# Patient Record
Sex: Female | Born: 1955 | Race: White | Hispanic: No | Smoking: Never smoker
Health system: Southern US, Community
[De-identification: ages and names within clinical notes are randomized; demographics above are authoritative.]

## PROBLEM LIST (undated history)

## (undated) DIAGNOSIS — F419 Anxiety disorder, unspecified: Secondary | ICD-10-CM

## (undated) DIAGNOSIS — T7840XA Allergy, unspecified, initial encounter: Secondary | ICD-10-CM

## (undated) DIAGNOSIS — I10 Essential (primary) hypertension: Secondary | ICD-10-CM

## (undated) HISTORY — DX: Anxiety disorder, unspecified: F41.9

## (undated) HISTORY — DX: Allergy, unspecified, initial encounter: T78.40XA

## (undated) HISTORY — DX: Essential (primary) hypertension: I10

---

## 1997-10-18 ENCOUNTER — Other Ambulatory Visit: Admission: RE | Admit: 1997-10-18 | Discharge: 1997-10-18 | Payer: Self-pay | Admitting: *Deleted

## 1998-10-15 ENCOUNTER — Encounter: Payer: Self-pay | Admitting: Neurosurgery

## 1998-10-15 ENCOUNTER — Ambulatory Visit (HOSPITAL_COMMUNITY): Admission: RE | Admit: 1998-10-15 | Discharge: 1998-10-15 | Payer: Self-pay | Admitting: Neurosurgery

## 2002-07-13 HISTORY — PX: ABDOMINAL HYSTERECTOMY: SHX81

## 2003-08-07 ENCOUNTER — Other Ambulatory Visit: Payer: Self-pay

## 2005-07-29 ENCOUNTER — Emergency Department: Payer: Self-pay | Admitting: General Practice

## 2007-05-14 HISTORY — PX: LITHOTRIPSY: SUR834

## 2007-05-19 ENCOUNTER — Ambulatory Visit: Payer: Self-pay | Admitting: Family Medicine

## 2007-05-20 ENCOUNTER — Ambulatory Visit: Payer: Self-pay | Admitting: Urology

## 2007-05-20 ENCOUNTER — Other Ambulatory Visit: Payer: Self-pay

## 2007-05-26 ENCOUNTER — Observation Stay: Payer: Self-pay | Admitting: Urology

## 2007-05-26 ENCOUNTER — Ambulatory Visit: Payer: Self-pay | Admitting: Urology

## 2007-06-20 ENCOUNTER — Ambulatory Visit: Payer: Self-pay | Admitting: Cardiology

## 2008-03-27 ENCOUNTER — Ambulatory Visit: Payer: Self-pay | Admitting: Internal Medicine

## 2008-03-30 ENCOUNTER — Ambulatory Visit: Payer: Self-pay | Admitting: Internal Medicine

## 2008-04-05 ENCOUNTER — Ambulatory Visit: Payer: Self-pay | Admitting: Internal Medicine

## 2008-04-13 ENCOUNTER — Ambulatory Visit: Payer: Self-pay | Admitting: Internal Medicine

## 2008-04-19 ENCOUNTER — Ambulatory Visit: Payer: Self-pay | Admitting: Internal Medicine

## 2009-04-23 ENCOUNTER — Ambulatory Visit: Payer: Self-pay | Admitting: Internal Medicine

## 2009-06-11 ENCOUNTER — Ambulatory Visit: Payer: Self-pay | Admitting: Internal Medicine

## 2009-06-12 LAB — HM MAMMOGRAPHY: HM Mammogram: NORMAL

## 2009-06-13 ENCOUNTER — Ambulatory Visit: Payer: Self-pay | Admitting: Internal Medicine

## 2010-04-16 ENCOUNTER — Ambulatory Visit: Payer: Self-pay | Admitting: Internal Medicine

## 2010-07-15 ENCOUNTER — Emergency Department: Payer: Self-pay | Admitting: Emergency Medicine

## 2010-07-24 ENCOUNTER — Ambulatory Visit: Payer: Self-pay | Admitting: Urology

## 2010-07-31 ENCOUNTER — Ambulatory Visit: Payer: Self-pay | Admitting: Urology

## 2010-09-12 ENCOUNTER — Ambulatory Visit: Payer: Self-pay | Admitting: Internal Medicine

## 2011-03-20 ENCOUNTER — Encounter: Payer: Self-pay | Admitting: Internal Medicine

## 2011-03-20 ENCOUNTER — Ambulatory Visit (INDEPENDENT_AMBULATORY_CARE_PROVIDER_SITE_OTHER): Payer: Self-pay | Admitting: Internal Medicine

## 2011-03-20 VITALS — BP 118/80 | HR 92 | Temp 98.1°F | Resp 16 | Ht 62.0 in | Wt 179.0 lb

## 2011-03-20 DIAGNOSIS — Z113 Encounter for screening for infections with a predominantly sexual mode of transmission: Secondary | ICD-10-CM

## 2011-03-20 DIAGNOSIS — R109 Unspecified abdominal pain: Secondary | ICD-10-CM

## 2011-03-20 DIAGNOSIS — N760 Acute vaginitis: Secondary | ICD-10-CM

## 2011-03-20 MED ORDER — METRONIDAZOLE 500 MG PO TABS: 500.0000 mg | ORAL_TABLET | Freq: Three times a day (TID) | ORAL | Status: AC

## 2011-03-20 NOTE — Patient Instructions (Signed)
Take the metrondazole three times daily with food for one week, We wil call you with the results of the other tests.

## 2011-03-20 NOTE — Progress Notes (Signed)
  Subjective:    Patient ID: Rachel Dickson, female    DOB: [birth year], 55 y.o.   MRN: [id number]  HPI  55 y o white female presents with an 8 day history of suprapubic and vaginal pain which started during her first sexual encounter in over 15 years.  Her boyfriend admits to several extra relationship affairs and recent symptoms of a urinary tract infection , but he has not had a medical evaluation. She is concerned that he gave her an STD and is requesting evaluation.  She has been having crampy abdominal pain and odorless vaginal discharge, without fevers or dysuria.    Past Medical History  Diagnosis Date  . Hypertension   . Anxiety   . Allergy    No current outpatient prescriptions on file prior to visit.    Review of Systems  Constitutional: Negative for fever, chills and unexpected weight change.  HENT: Negative for hearing loss, ear pain, nosebleeds, congestion, sore throat, facial swelling, rhinorrhea, sneezing, mouth sores, trouble swallowing, neck pain, neck stiffness, voice change, postnasal drip, sinus pressure, tinnitus and ear discharge.   Eyes: Negative for pain, discharge, redness and visual disturbance.  Respiratory: Negative for cough, chest tightness, shortness of breath, wheezing and stridor.   Cardiovascular: Negative for chest pain, palpitations and leg swelling.  Musculoskeletal: Negative for myalgias and arthralgias.  Skin: Negative for color change and rash.  Neurological: Negative for dizziness, weakness, light-headedness and headaches.  Hematological: Negative for adenopathy.       Objective:   Physical Exam  Constitutional: She is oriented to person, place, and time. She appears well-developed and well-nourished.  HENT:  Mouth/Throat: Oropharynx is clear and moist.  Eyes: EOM are normal. Pupils are equal, round, and reactive to light. No scleral icterus.  Neck: Normal range of motion. Neck supple. No JVD present. No thyromegaly present.  Cardiovascular:  Normal rate, regular rhythm, normal heart sounds and intact distal pulses.   Pulmonary/Chest: Effort normal and breath sounds normal.  Abdominal: Soft. Bowel sounds are normal. She exhibits no mass. There is no tenderness.  Genitourinary: Vaginal discharge found.  Musculoskeletal: Normal range of motion. She exhibits no edema.  Lymphadenopathy:    She has no cervical adenopathy.  Neurological: She is alert and oriented to person, place, and time.  Skin: Skin is warm and dry.  Psychiatric: She has a normal mood and affect.          Assessment & Plan:

## 2011-03-21 LAB — GC/CHLAMYDIA PROBE AMP, GENITAL
Chlamydia, DNA Probe: NEGATIVE
GC Probe Amp, Genital: NEGATIVE

## 2011-03-21 LAB — URINALYSIS
Glucose, UA: NEGATIVE mg/dL
Ketones, ur: NEGATIVE mg/dL
Leukocytes, UA: NEGATIVE
Nitrite: NEGATIVE
Protein, ur: NEGATIVE mg/dL
Specific Gravity, Urine: 1.022 (ref 1.005–1.030)
Urobilinogen, UA: 0.2 mg/dL (ref 0.0–1.0)
pH: 6 (ref 5.0–8.0)

## 2011-03-22 DIAGNOSIS — N760 Acute vaginitis: Secondary | ICD-10-CM | POA: Insufficient documentation

## 2011-03-22 DIAGNOSIS — Z113 Encounter for screening for infections with a predominantly sexual mode of transmission: Secondary | ICD-10-CM | POA: Insufficient documentation

## 2011-03-22 NOTE — Assessment & Plan Note (Addendum)
Her pelvic exam was normal except for some normal appearing vaginal discharge.  At patient's request I am sending a sample for GC and chlamydia and will treat her empirically for B [place]alla vaginitis.  I have not testeedher for HIV since she has no symptoms of seroconversion and is very low likelihood of an accurately positive test at this point (8 days after a painful sexual encounter). Spent 15 minutes of a 30 minute encounter discussing her recent sexual encounter and her frustration and disappointment at her boyfriend's infidelity.  Also advised her  To avooid female douches, which she used recently.

## 2011-03-22 NOTE — Assessment & Plan Note (Signed)
GC and chlamydia probes were sent today.

## 2011-03-23 ENCOUNTER — Telehealth: Payer: Self-pay | Admitting: Internal Medicine

## 2011-03-23 MED ORDER — ALPRAZOLAM 0.5 MG PO TABS: 0.5000 mg | ORAL_TABLET | Freq: Every day | ORAL | Status: AC | PRN

## 2011-03-23 NOTE — Telephone Encounter (Signed)
Message copied by Duncan Dull on Mon Mar 23, 2011  3:23 PM ------      Message from: Darletta Moll A      Created: Mon Mar 23, 2011  1:07 PM       Patient notified of results.  Patient stated you have treated her in the past for depression and anxiety.  She stated with all she has just went through she has been really anxious but not emotional like she was before.  She stated she stays sick on her stomach, and nauseous.  She said in the past you prescribed xanax and was wanting to know if you could do that again.  Please advise.

## 2011-03-23 NOTE — Telephone Encounter (Signed)
Yes you can call her in alprazolam 0.5 mg once daily as needed for anxiety  #30 no refills.

## 2011-03-23 NOTE — Telephone Encounter (Signed)
Rx has been called in  

## 2011-03-25 ENCOUNTER — Emergency Department: Payer: Self-pay | Admitting: Emergency Medicine

## 2011-12-10 ENCOUNTER — Other Ambulatory Visit: Payer: Self-pay | Admitting: Internal Medicine

## 2012-07-29 ENCOUNTER — Telehealth: Payer: Self-pay | Admitting: *Deleted

## 2012-07-29 DIAGNOSIS — Z79899 Other long term (current) drug therapy: Secondary | ICD-10-CM

## 2012-07-29 DIAGNOSIS — Z13220 Encounter for screening for lipoid disorders: Secondary | ICD-10-CM

## 2012-07-29 DIAGNOSIS — R5383 Other fatigue: Secondary | ICD-10-CM

## 2012-07-29 NOTE — Telephone Encounter (Signed)
added

## 2012-07-29 NOTE — Addendum Note (Signed)
Addended by: Sherlene Shams on: 07/29/2012 12:49 PM   Modules accepted: Orders

## 2012-07-29 NOTE — Telephone Encounter (Signed)
Pt is coming in for labs Monday (01.20.2014) What labs and dx would you like? Thank you

## 2012-08-01 ENCOUNTER — Other Ambulatory Visit (INDEPENDENT_AMBULATORY_CARE_PROVIDER_SITE_OTHER): Payer: 59

## 2012-08-01 ENCOUNTER — Ambulatory Visit (INDEPENDENT_AMBULATORY_CARE_PROVIDER_SITE_OTHER): Payer: 59 | Admitting: *Deleted

## 2012-08-01 DIAGNOSIS — R5381 Other malaise: Secondary | ICD-10-CM

## 2012-08-01 DIAGNOSIS — Z13220 Encounter for screening for lipoid disorders: Secondary | ICD-10-CM

## 2012-08-01 DIAGNOSIS — Z79899 Other long term (current) drug therapy: Secondary | ICD-10-CM

## 2012-08-01 DIAGNOSIS — Z23 Encounter for immunization: Secondary | ICD-10-CM

## 2012-08-01 DIAGNOSIS — R5383 Other fatigue: Secondary | ICD-10-CM

## 2012-08-01 LAB — COMPREHENSIVE METABOLIC PANEL
AST: 32 U/L (ref 0–37)
Albumin: 3.7 g/dL (ref 3.5–5.2)
BUN: 20 mg/dL (ref 6–23)
CO2: 28 mEq/L (ref 19–32)
Calcium: 9 mg/dL (ref 8.4–10.5)
Chloride: 103 mEq/L (ref 96–112)
Creatinine, Ser: 0.9 mg/dL (ref 0.4–1.2)
GFR: 72.66 mL/min (ref >60.00)
Glucose, Bld: 106 mg/dL — ABNORMAL HIGH (ref 70–99)
Potassium: 4.2 mEq/L (ref 3.5–5.1)

## 2012-08-01 LAB — TSH: TSH: 3.57 u[IU]/mL (ref 0.35–5.50)

## 2012-08-01 LAB — LIPID PANEL
Cholesterol: 208 mg/dL — ABNORMAL HIGH (ref 0–200)
Total CHOL/HDL Ratio: 4
Triglycerides: 118 mg/dL (ref 0.0–149.0)

## 2012-08-05 ENCOUNTER — Encounter: Payer: Self-pay | Admitting: Internal Medicine

## 2012-08-05 ENCOUNTER — Ambulatory Visit (INDEPENDENT_AMBULATORY_CARE_PROVIDER_SITE_OTHER): Payer: 59 | Admitting: Internal Medicine

## 2012-08-05 VITALS — BP 120/82 | HR 90 | Temp 98.0°F | Resp 16 | Ht 62.0 in | Wt 190.5 lb

## 2012-08-05 DIAGNOSIS — Z1211 Encounter for screening for malignant neoplasm of colon: Secondary | ICD-10-CM

## 2012-08-05 DIAGNOSIS — L639 Alopecia areata, unspecified: Secondary | ICD-10-CM

## 2012-08-05 DIAGNOSIS — Z0000 Encounter for general adult medical examination without abnormal findings: Secondary | ICD-10-CM

## 2012-08-05 DIAGNOSIS — Z1239 Encounter for other screening for malignant neoplasm of breast: Secondary | ICD-10-CM

## 2012-08-05 DIAGNOSIS — E669 Obesity, unspecified: Secondary | ICD-10-CM

## 2012-08-05 LAB — FECAL OCCULT BLOOD, GUAIAC: Fecal Occult Blood: NEGATIVE

## 2012-08-05 MED ORDER — MOMETASONE FUROATE 50 MCG/ACT NA SUSP: 2.0000 | Freq: Every day | NASAL | Status: DC

## 2012-08-05 NOTE — Progress Notes (Signed)
Patient ID: Rachel Dickson, female   DOB: Feb 22, 1956, 57 y.o.   MRN: [id number]  Patient Active Problem List  Diagnosis  . Encounter for screening for infections with predominantly sexual mode of transmission  . Routine general medical examination at a health care facility  . Alopecia areata  . Obesity (BMI 30-39.9)    Subjective:  CC:   Chief Complaint  Patient presents with  . Annual Exam    HPI:   Rachel Fesperman Smithis a 57 y.o. female who presents Need for annual exam and screening labs/mammogram to obtain insurance rate reduction. Patient has been lost to followup due to losing insurance. She has several complaints today including  1)  hair loss thinning hair  2)  labile mood with decreased emotional fragility. 3)  She is having hot flashes last several weeks. She underwent hysterectomy at age 71 . 4) weight gain.  She has gained 19 pounds over the last year. She has a 40  minute one-way commute to work and does not exercise regularly and has not followed a specific diet plan. She does have plans to join a gym and obtain personal training for her daughter who is a Systems analyst.   5) Recurrent right ear pressure and sinus drainage.     Past Medical History  Diagnosis Date  . Hypertension   . Anxiety   . Allergy     Past Surgical History  Procedure Date  . Lithotripsy Nov 2008    Hospitialized [facility]  . Abdominal hysterectomy 2004         The following portions of the patient's history were reviewed and updated as appropriate: Allergies, current medications, and problem list.    Review of Systems:  Patient denies headache, fevers, malaise, unintentional weight loss, skin rash, eye pain, sinus congestion and sinus pain, sore throat, dysphagia,  hemoptysis , cough, dyspnea, wheezing, chest pain, palpitations, orthopnea, edema, abdominal pain, nausea, melena, diarrhea, constipation, flank pain, dysuria, hematuria, urinary  Frequency, nocturia, numbness, tingling,  seizures,  Focal weakness, Loss of consciousness,  Tremor, insomnia, depression, anxiety, and suicidal ideation.     History   Social History  . Marital Status: Widowed    Spouse Name: N/A    Number of Children: N/A  . Years of Education: N/A   Occupational History  . Not on file.   Social History Main Topics  . Smoking status: Never Smoker   . Smokeless tobacco: Never Used  . Alcohol Use: Yes     Comment: occasional  . Drug Use: No  . Sexually Active: Not on file   Other Topics Concern  . Not on file   Social History Narrative  . No narrative on file    Objective:  BP 120/82  Pulse 90  Temp 98 F (36.7 C) (Oral)  Resp 16  Ht 5\' 2"  (1.575 m)  Wt 190 lb 8 oz (86.41 kg)  BMI 34.84 kg/m2  SpO2 97%  General appearance: alert, cooperative and appears stated age Ears: normal TM's and external ear canals both ears Throat: lips, mucosa, and tongue normal; teeth and gums normal Neck: no adenopathy, no carotid bruit, supple, symmetrical, trachea midline and thyroid not enlarged, symmetric, no tenderness/mass/nodules Back: symmetric, no curvature. ROM normal. No CVA tenderness. Lungs: clear to auscultation bilaterally Heart: regular rate and rhythm, S1, S2 normal, no murmur, click, rub or gallop Abdomen: soft, non-tender; bowel sounds normal; no masses,  no organomegaly Pulses: 2+ and symmetric Skin: Skin color, texture, turgor normal. No  rashes or lesions Lymph nodes: Cervical, supraclavicular, and axillary nodes normal. Rectal: heme negative, no masses.   Assessment and Plan:  Routine general medical examination at a health care facility Comprehensive medical exam without breast and pelvic was done today. Rectal exam was done to obtain fecal occult blood testing (which was negative) as required by her insurance. Mammogram has been ordered.  Alopecia areata She does have some thinning on the crown of her of her scalp but not in a female pattern baldness pattern.  Checking thyroid function with next screening lab draw  Obesity (BMI 30-39.9) I have addressed  BMI and recommended a low glycemic index diet utilizing smaller more frequent meals to increase metabolism.  I have also recommended that patient start exercising with a goal of 30 minutes of aerobic exercise a minimum of 5 days per week. Screening for lipid disorders, thyroid and diabetes was done prior to visit and LDL was 130,  Fasting glucose 106 and TSH 3.57      Updated Medication List Outpatient Encounter Prescriptions as of 08/05/2012  Medication Sig Dispense Refill  . Biotin 1000 MCG tablet Take 1,000 mcg by mouth daily.      . COLLAGEN PO Take by mouth.      . folic acid (FOLVITE) 1 MG tablet Take 1 mg by mouth daily.      Marland Kitchen lisinopril (PRINIVIL,ZESTRIL) 40 MG tablet TAKE ONE TABLET BY MOUTH EVERY DAY  90 tablet  3  . Multiple Vitamin (MULTIVITAMIN) tablet Take 1 tablet by mouth daily.      . [DISCONTINUED] Calcium Carbonate-Vitamin D (CALCIUM + D) 600-200 MG-UNIT TABS Take 1 tablet by mouth 2 (two) times daily.        . mometasone (NASONEX) 50 MCG/ACT nasal spray Place 2 sprays into the nose daily.  17 g  12  . [DISCONTINUED] glucosamine-chondroitin 500-400 MG tablet Take 1 tablet by mouth 3 (three) times daily.        . [DISCONTINUED] Misc Natural Products (COLON CLEANSER PO) Take 1 tablet by mouth daily.           Orders Placed This Encounter  Procedures  . MM Digital Screening    Return in about 3 months (around 11/03/2012).

## 2012-08-05 NOTE — Patient Instructions (Addendum)
Your cholesterol, thyroid, liver and kidney function are fine.   You need to lose 10%  Of your current body weight over the next 6 months   This is  my version of a  "Low GI"  Diet:  It is not ultra low carb, but will still lower your blood sugars and allow you to lose 5 to 10 lbs per month if you follow it carefully. All of the foods can be found at grocery stores and in bulk at [employer].  The Atkins protein bars and shakes are available in more varieties at [employer], [employer] and [employer].     7 AM Breakfast:  Low carbohydrate Protein  Shakes (I recommend the EAS AdvantEdge "Carb Control" shakes  Or the low carb shakes by Atkins.   Both are available everywhere:  In  cases at [employer]Js  Or in 4 packs at grocery stores and pharmacies  2.5 carbs  (Alternative is  a toasted Arnold's Sandwhich Thin w/ peanut butter, a "Bagel Thin" with cream cheese and salmon) or  a scrambled egg burrito made with a low carb tortilla .  Avoid cereal and bananas, oatmeal too unless you are cooking the old fashioned kind that takes 30-40 minutes to prepare.  the rest is overly processed, has minimal fiber, and is loaded with carbohydrates!   10 AM: Protein bar by Atkins (the snack size, under 200 cal).  There are many varieties , available widely again or in bulk in limited varieties at [employer]Js)  Other so called "protein bars" tend to be loaded with carbohydrates.  Remember, in food advertising, the word "energy" is synonymous for " carbohydrate."  Lunch: sandwich of [place], (or any lunchmeat, grilled meat or canned tuna), fresh avocado, mayonnaise  and cheese on a lower carbohydrate pita bread, flatbread, or tortilla . Ok to use regular mayonnaise. The bread is the only source or carbohydrate that can be decreased (Joseph's makes a pita bread and a flat bread that are 50 cal and 4 net carbs ; Toufayan makes a low carb flatbread that's 100 cal and 9 net carbs  and  Mission makes a low carb whole wheat tortilla  That is 210 cal and 6  net carbs)  3 PM:  Mid day :  Another protein bar,  Or a  cheese stick (100 cal, 0 carbs),  Or 1 ounce of  almonds, walnuts, pistachios, pecans, peanuts,  Macadamia nuts. Or a Dannon light n Fit greek yogurt, 80 cal 8 net carbs . Avoid "granola"; the dried cranberries and raisins are loaded with carbohydrates. Mixed nuts ok if no raisins or cranberries or dried fruit.      6 PM  Dinner:  "mean and green:"  Meat/chicken/fish or a high protein legume; , with a green salad, and a low GI  Veggie (broccoli, cauliflower, green beans, spinach, brussel sprouts. Lima beans) : Avoid "Low fat dressings, as well as Reyne Dumas and [address]! They are loaded with sugar! Instead use ranch, vinagrette,  Blue cheese, etc.  There is a low carb pasta by Dreamfield's available at [facility] that is acceptable and tastes great. Try Michel Angel's chicken piccata over low carb pasta. The chicken dish is 0 carbs, and can be found in frozen section at BJs and [employer]. Also try [employer]" (pulled pork, no sauce,  0 carbs) and his pot roast.   both are in the refrigerated section at BJs   Dreamfield's makes a low carb pasta only 5 g/serving.  Available at all grocery stores,  And tastes like normal pasta  9 PM snack : Breyer's "low carb" fudgsicle or  ice cream bar (Carb Smart line), or  Weight Watcher's ice cream bar , or another "no sugar added" ice cream;a serving of fresh berries/cherries with whipped cream (Avoid bananas, pineapple, grapes  and watermelon on a regular basis because they are high in sugar)   Remember that snack Substitutions should be less than 10 carbs per serving and meals < 20 carbs. Remember to subtract fiber grams and sugar alcohols to get the "net carbs."

## 2012-08-07 ENCOUNTER — Encounter: Payer: Self-pay | Admitting: Internal Medicine

## 2012-08-07 DIAGNOSIS — Z0000 Encounter for general adult medical examination without abnormal findings: Secondary | ICD-10-CM | POA: Insufficient documentation

## 2012-08-07 DIAGNOSIS — L639 Alopecia areata, unspecified: Secondary | ICD-10-CM | POA: Insufficient documentation

## 2012-08-07 DIAGNOSIS — E669 Obesity, unspecified: Secondary | ICD-10-CM | POA: Insufficient documentation

## 2012-08-07 NOTE — Assessment & Plan Note (Addendum)
Comprehensive medical exam without breast and pelvic was done today. Rectal exam was done to obtain fecal occult blood testing as required by her insurance. Mammogram has been ordered.

## 2012-08-07 NOTE — Assessment & Plan Note (Addendum)
I have addressed  BMI and recommended a low glycemic index diet utilizing smaller more frequent meals to increase metabolism.  I have also recommended that patient start exercising with a goal of 30 minutes of aerobic exercise a minimum of 5 days per week. Screening for lipid disorders, thyroid and diabetes was done prior to visit and LDL was 130,  Fasting glucose 106 and TSH 3.57

## 2012-08-07 NOTE — Assessment & Plan Note (Signed)
She does have some thinning on the crown of her of her scalp but not in a female pattern baldness pattern. Checking thyroid function with next screening lab draw

## 2012-08-18 ENCOUNTER — Encounter: Payer: Self-pay | Admitting: Internal Medicine

## 2012-08-19 ENCOUNTER — Encounter: Payer: Self-pay | Admitting: Internal Medicine

## 2012-09-26 ENCOUNTER — Encounter: Payer: Self-pay | Admitting: Internal Medicine

## 2012-11-02 ENCOUNTER — Ambulatory Visit: Payer: 59 | Admitting: Internal Medicine

## 2012-12-30 ENCOUNTER — Telehealth: Payer: Self-pay | Admitting: *Deleted

## 2012-12-30 MED ORDER — LISINOPRIL 40 MG PO TABS: ORAL_TABLET | ORAL | Status: DC

## 2012-12-30 NOTE — Telephone Encounter (Signed)
Patient called needing refill on BP medication advised patient needs follow up appointment scheduled. Patient stated she will call back for appointment cannot come in until approved at work refill 90 no refills.

## 2013-05-18 ENCOUNTER — Other Ambulatory Visit: Payer: Self-pay

## 2013-06-23 ENCOUNTER — Telehealth: Payer: Self-pay | Admitting: Internal Medicine

## 2013-06-23 NOTE — Telephone Encounter (Signed)
Left message for patient to return call.

## 2013-06-23 NOTE — Telephone Encounter (Signed)
Patient needs to know exact date she was diagnosed with Hypertension.

## 2013-06-23 NOTE — Telephone Encounter (Signed)
Pt is filling out paperwork for insurance and needs help to answer:    Date high blood pressure was diagnosed? Date of last BP reading and what it was.  Pt asking for call back soon with this information as the paperwork is past due.

## 2013-06-26 NOTE — Telephone Encounter (Signed)
Date of diagnosis is unknown.  It was certainly  Prior toher rst visit here,  But no prior records are  available.  120/82 Aug 05 2012 is last BP reading

## 2013-06-30 NOTE — Telephone Encounter (Signed)
Sent myChart message with information.

## 2014-04-27 ENCOUNTER — Other Ambulatory Visit: Payer: Self-pay

## 2015-08-06 ENCOUNTER — Telehealth: Payer: Self-pay | Admitting: *Deleted

## 2015-08-06 NOTE — Telephone Encounter (Signed)
Pt called left name and phone.  I reviewed the [facility] Chart Review and pt has not been seen by [facility] in at least 3 years.  I left message informing pt she left no message other than her name and phone number, and since we have no history for her it would benefit her to make an appt.  I left message with our NEW Patient contact number.

## 2017-07-23 ENCOUNTER — Encounter: Payer: Self-pay | Admitting: *Deleted

## 2017-07-28 ENCOUNTER — Other Ambulatory Visit: Payer: Self-pay | Admitting: Sports Medicine

## 2017-07-28 DIAGNOSIS — M51369 Other intervertebral disc degeneration, lumbar region without mention of lumbar back pain or lower extremity pain: Secondary | ICD-10-CM

## 2017-07-28 DIAGNOSIS — M533 Sacrococcygeal disorders, not elsewhere classified: Secondary | ICD-10-CM

## 2017-07-28 DIAGNOSIS — M5136 Other intervertebral disc degeneration, lumbar region: Secondary | ICD-10-CM

## 2017-07-28 DIAGNOSIS — M5441 Lumbago with sciatica, right side: Secondary | ICD-10-CM

## 2017-07-30 ENCOUNTER — Ambulatory Visit
Admission: RE | Admit: 2017-07-30 | Discharge: 2017-07-30 | Disposition: A | Discharge status: Home / Self Care | Payer: 59 | Source: Ambulatory Visit | Attending: Sports Medicine | Admitting: Sports Medicine

## 2017-07-30 DIAGNOSIS — M5136 Other intervertebral disc degeneration, lumbar region: Secondary | ICD-10-CM

## 2017-07-30 DIAGNOSIS — M533 Sacrococcygeal disorders, not elsewhere classified: Secondary | ICD-10-CM

## 2017-07-30 DIAGNOSIS — M5441 Lumbago with sciatica, right side: Secondary | ICD-10-CM

## 2018-04-20 ENCOUNTER — Other Ambulatory Visit: Payer: Self-pay | Admitting: Internal Medicine

## 2018-04-20 DIAGNOSIS — Z1231 Encounter for screening mammogram for malignant neoplasm of breast: Secondary | ICD-10-CM

## 2019-01-20 ENCOUNTER — Other Ambulatory Visit: Payer: Self-pay | Admitting: Internal Medicine

## 2019-01-20 DIAGNOSIS — Z1231 Encounter for screening mammogram for malignant neoplasm of breast: Secondary | ICD-10-CM

## 2019-11-27 ENCOUNTER — Emergency Department: Payer: 59

## 2019-11-27 ENCOUNTER — Emergency Department
Admission: EM | Admit: 2019-11-27 | Discharge: 2019-11-27 | Disposition: A | Discharge status: Home / Self Care | Payer: 59 | Attending: Emergency Medicine | Admitting: Emergency Medicine

## 2019-11-27 ENCOUNTER — Other Ambulatory Visit: Payer: Self-pay

## 2019-11-27 DIAGNOSIS — R109 Unspecified abdominal pain: Secondary | ICD-10-CM | POA: Diagnosis present

## 2019-11-27 DIAGNOSIS — Z79899 Other long term (current) drug therapy: Secondary | ICD-10-CM | POA: Insufficient documentation

## 2019-11-27 DIAGNOSIS — N201 Calculus of ureter: Secondary | ICD-10-CM | POA: Diagnosis not present

## 2019-11-27 DIAGNOSIS — I10 Essential (primary) hypertension: Secondary | ICD-10-CM | POA: Diagnosis not present

## 2019-11-27 LAB — BASIC METABOLIC PANEL
Anion gap: 10 (ref 5–15)
BUN: 23 mg/dL (ref 8–23)
CO2: 23 mmol/L (ref 22–32)
Calcium: 9.2 mg/dL (ref 8.9–10.3)
Chloride: 103 mmol/L (ref 98–111)
Creatinine, Ser: 1.12 mg/dL — ABNORMAL HIGH (ref 0.44–1.00)
GFR calc Af Amer: >60 mL/min (ref >60)
GFR calc non Af Amer: 53 mL/min — ABNORMAL LOW (ref >60)
Glucose, Bld: 133 mg/dL — ABNORMAL HIGH (ref 70–99)
Potassium: 4.1 mmol/L (ref 3.5–5.1)
Sodium: 136 mmol/L (ref 135–145)

## 2019-11-27 LAB — CBC
HCT: 39.4 % (ref 36.0–46.0)
Hemoglobin: 13.8 g/dL (ref 12.0–15.0)
MCH: 29.4 pg (ref 26.0–34.0)
MCHC: 35 g/dL (ref 30.0–36.0)
MCV: 83.8 fL (ref 80.0–100.0)
Platelets: 324 10*3/uL (ref 150–400)
RBC: 4.7 MIL/uL (ref 3.87–5.11)
RDW: 13.6 % (ref 11.5–15.5)
WBC: 11 10*3/uL — ABNORMAL HIGH (ref 4.0–10.5)
nRBC: 0 % (ref 0.0–0.2)

## 2019-11-27 LAB — URINALYSIS, COMPLETE (UACMP) WITH MICROSCOPIC
Bilirubin Urine: NEGATIVE
Glucose, UA: NEGATIVE mg/dL
Ketones, ur: NEGATIVE mg/dL
Leukocytes,Ua: NEGATIVE
Nitrite: NEGATIVE
Protein, ur: NEGATIVE mg/dL
Specific Gravity, Urine: 1.008 (ref 1.005–1.030)
pH: 5 (ref 5.0–8.0)

## 2019-11-27 MED ORDER — SODIUM CHLORIDE 0.9 % IV SOLN
1.0000 g | Freq: Once | INTRAVENOUS | Status: AC
  Administered 2019-11-27: 1 g via INTRAVENOUS
  Filled 2019-11-27: qty 10

## 2019-11-27 MED ORDER — CEPHALEXIN 500 MG PO CAPS: 500.0000 mg | ORAL_CAPSULE | Freq: Two times a day (BID) | ORAL | 0 refills | Status: AC

## 2019-11-27 MED ORDER — ONDANSETRON HCL 4 MG/2ML IJ SOLN
4.0000 mg | Freq: Once | INTRAMUSCULAR | Status: AC
  Administered 2019-11-27: 4 mg via INTRAVENOUS
  Filled 2019-11-27: qty 2

## 2019-11-27 MED ORDER — OXYCODONE-ACETAMINOPHEN 5-325 MG PO TABS: 1.0000 | ORAL_TABLET | Freq: Once | ORAL | Status: DC

## 2019-11-27 MED ORDER — OXYCODONE-ACETAMINOPHEN 5-325 MG PO TABS
1.0000 | ORAL_TABLET | ORAL | Status: DC | PRN
  Administered 2019-11-27: 1 via ORAL
  Filled 2019-11-27: qty 1

## 2019-11-27 MED ORDER — ONDANSETRON 4 MG PO TBDP
4.0000 mg | ORAL_TABLET | Freq: Once | ORAL | Status: AC
  Administered 2019-11-27: 4 mg via ORAL

## 2019-11-27 MED ORDER — SODIUM CHLORIDE 0.9 % IV BOLUS
1000.0000 mL | Freq: Once | INTRAVENOUS | Status: AC
  Administered 2019-11-27: 1000 mL via INTRAVENOUS

## 2019-11-27 MED ORDER — KETOROLAC TROMETHAMINE 30 MG/ML IJ SOLN
30.0000 mg | Freq: Once | INTRAMUSCULAR | Status: AC
  Administered 2019-11-27: 30 mg via INTRAVENOUS
  Filled 2019-11-27: qty 1

## 2019-11-27 MED ORDER — MORPHINE SULFATE (PF) 4 MG/ML IV SOLN: 4.0000 mg | Freq: Once | INTRAVENOUS | Status: DC

## 2019-11-27 NOTE — ED Provider Notes (Signed)
[facility] Emergency Department Provider Note ____________________________________________   First MD Initiated Contact with Patient 11/27/19 0825     (approximate)  I have reviewed the triage vital signs and the nursing notes.   HISTORY  Chief Complaint Flank Pain    HPI Rachel Dickson is a 64 y.o. female with PMH as noted below including a history of prior kidney stones who presents with right flank pain over the last 2 days, intermittent course, now constant since last night, and associated with nausea and vomiting.  The patient also reports some dark urine.  She states that the pain felt similar to prior kidney stones.  She also reports an episode yesterday where she was in acute pain and all of her toes became dark and dusky in color on both sides.  This resolved after short time and was not associated with any pain or numbness.  Past Medical History:  Diagnosis Date  . Allergy   . Anxiety   . Hypertension     Patient Active Problem List   Diagnosis Date Noted  . Routine general medical examination at a health care facility 08/07/2012  . Alopecia areata 08/07/2012  . Obesity (BMI 30-39.9) 08/07/2012  . Encounter for screening for infections with predominantly sexual mode of transmission 03/22/2011    Past Surgical History:  Procedure Laterality Date  . ABDOMINAL HYSTERECTOMY  2004  . LITHOTRIPSY  Nov 2008   Hospitialized [facility]    Prior to Admission medications   Medication Sig Start Date End Date Taking? Authorizing Provider  estradiol (CLIMARA - DOSED IN MG/24 HR) 0.05 mg/24hr patch Place 0.05 mg onto the skin once a week. 10/21/19  Yes [provider]  hydrochlorothiazide (HYDRODIURIL) 12.5 MG tablet Take 12.5 mg by mouth every other day. 07/28/19 07/27/20 Yes [provider]  lisinopril (ZESTRIL) 20 MG tablet Take 20 mg by mouth daily. 11/04/19  Yes [provider]  cephALEXin (KEFLEX) 500 MG capsule Take 1  capsule (500 mg total) by mouth 2 (two) times daily for 7 days. 11/27/19 12/04/19  Arta Silence, MD    Allergies Ciprofloxacin, Chlorhexidine, Erythromycin, Macrolides and ketolides, Sulfa drugs cross reactors, and Chloral hydrate  Family History  Problem Relation Age of Onset  . Cancer Neg Hx     Social History Social History   Tobacco Use  . Smoking status: Never Smoker  . Smokeless tobacco: Never Used  Substance Use Topics  . Alcohol use: Yes    Comment: occasional  . Drug use: No    Review of Systems  Constitutional: No fever/chills. Eyes: No redness. ENT: No sore throat. Cardiovascular: Denies chest pain. Respiratory: Denies shortness of breath. Gastrointestinal: Positive for nausea. Genitourinary: Positive for hematuria. Musculoskeletal: Negative for back pain. Skin: Negative for rash. Neurological: Negative for headache.   ____________________________________________   PHYSICAL EXAM:  VITAL SIGNS: ED Triage Vitals  Enc Vitals Group     BP 11/27/19 0542 (!) 175/84     Pulse Rate 11/27/19 0542 80     Resp 11/27/19 0542 18     Temp 11/27/19 0542 97.6 F (36.4 C)     Temp src --      SpO2 11/27/19 0542 97 %     Weight 11/27/19 0532 190 lb 7.6 oz (86.4 kg)     Height 11/27/19 0532 5\' 2"  (1.575 m)     Head Circumference --      Peak Flow --      Pain Score 11/27/19 0532 10  Pain Loc --      Pain Edu? --      Excl. in GC? --     Constitutional: Alert and oriented. Well appearing and in no acute distress. Eyes: Conjunctivae are normal.  Head: Atraumatic. Nose: No congestion/rhinnorhea. Mouth/Throat: Mucous membranes are moist.   Neck: Normal range of motion.  Cardiovascular: Normal rate, regular rhythm.  Good peripheral circulation. Respiratory: Normal respiratory effort.  No retractions.  Gastrointestinal: Soft and nontender. No distention.  Genitourinary: No CVA tenderness. Musculoskeletal: Extremities warm and well perfused.  All toes  with normal color, cap refill less than 2 seconds. Neurologic:  Normal speech and language. No gross focal neurologic deficits are appreciated.  Skin:  Skin is warm and dry. No rash noted. Psychiatric: Mood and affect are normal. Speech and behavior are normal.  ____________________________________________   LABS (all labs ordered are listed, but only abnormal results are displayed)  Labs Reviewed  URINALYSIS, COMPLETE (UACMP) WITH MICROSCOPIC - Abnormal; Notable for the following components:      Result Value   Color, Urine YELLOW (*)    APPearance HAZY (*)    Hgb urine dipstick MODERATE (*)    Bacteria, UA MANY (*)    All other components within normal limits  BASIC METABOLIC PANEL - Abnormal; Notable for the following components:   Glucose, Bld 133 (*)    Creatinine, Ser 1.12 (*)    GFR calc non Af Amer 53 (*)    All other components within normal limits  CBC - Abnormal; Notable for the following components:   WBC 11.0 (*)    All other components within normal limits   ____________________________________________  EKG   ____________________________________________  RADIOLOGY  CT abdomen: 7 x 10 mm stone in the right ureter with hydronephrosis  ____________________________________________   PROCEDURES  Procedure(s) performed: No  Procedures  Critical Care performed: No ____________________________________________   INITIAL IMPRESSION / ASSESSMENT AND PLAN / ED COURSE  Pertinent labs & imaging results that were available during my care of the patient were reviewed by me and considered in my medical decision making (see chart for details).  64 year old female with PMH as noted above including a remote history of kidney stones presents with right flank pain over the last 2 days associated with intermittent nausea and vomiting as well as some dark urine.  The patient had an episode of color change in her toes yesterday during acute pain, which was bilateral,  painless, and consistent with Raynaud's phenomenon.  On exam currently, the patient is relatively comfortable appearing.  Her vital signs are normal except for hypertension.  The abdomen is soft and nontender.  She has no significant CVA tenderness.  Lab work-up is unremarkable.  Urinalysis shows significant RBCs and some WBCs although clinically she has no signs or symptoms of a UTI.  CT renal stone study shows a large stone in the right ureter.  Overall presentation is consistent with pain related to this ureteral stone.  Given the size it may require intervention, however the patient is relatively comfortable appearing and I anticipate that her pain will be well controlled.  We we will give Toradol and Zofran here.  I will discuss with urology to arrange for close follow-up.  ----------------------------------------- 12:15 PM on 11/27/2019 -----------------------------------------  The patient had good relief with Toradol.  I discussed the case with Dr. Sheppard Penton from urology who recommends that the patient follow-up with him this afternoon at 1 PM to decide on the need for further intervention.  The lab work-up is reassuring although the patient does have some WBCs and bacteria on the urinalysis.  Overall clinically have a low suspicion for active UTI and she has no symptoms of pyelonephritis and no signs of sepsis.  At this time there is no indication for admission.  I gave a dose of ceftriaxone in the ED and will prescribe Keflex.  At this time, appears well and feels good to be discharged.  Her pain is starting to come back, but since she is driving herself to the urology appointment I do not want to give additional narcotic analgesics, and she agrees with this.  I gave her thorough return precautions, and she expresses understanding.  ____________________________________________   FINAL CLINICAL IMPRESSION(S) / ED DIAGNOSES  Final diagnoses:  Ureteral stone      NEW MEDICATIONS  STARTED DURING THIS VISIT:  Discharge Medication List as of 11/27/2019 12:11 PM    START taking these medications   Details  cephALEXin (KEFLEX) 500 MG capsule Take 1 capsule (500 mg total) by mouth 2 (two) times daily for 7 days., Starting Mon 11/27/2019, Until Mon 12/04/2019, Normal         Note:  This document was prepared using Dragon voice recognition software and may include unintentional dictation errors.    Dionne Bucy, MD 11/27/19 1216

## 2019-11-27 NOTE — Discharge Instructions (Signed)
Go to Dr. Amada Jupiter office now for 1 PM.  Return to the ER immediately for new, worsening, or persistent severe pain, vomiting, fever, weakness, or any other new or worsening symptoms that concern you.

## 2019-11-27 NOTE — ED Triage Notes (Signed)
Patient c/o flank pain radiating to abdomen. Patient c/o N/V.

## 2019-11-27 NOTE — ED Notes (Signed)
MD at bedside to assess patient. Will continue to monitor.

## 2019-11-30 ENCOUNTER — Encounter
Admission: RE | Disposition: A | Discharge status: Home / Self Care | Payer: Self-pay | Source: Home / Self Care | Attending: Urology

## 2019-11-30 ENCOUNTER — Other Ambulatory Visit: Payer: Self-pay

## 2019-11-30 ENCOUNTER — Encounter: Payer: Self-pay | Admitting: Urology

## 2019-11-30 ENCOUNTER — Ambulatory Visit
Admission: RE | Admit: 2019-11-30 | Discharge: 2019-11-30 | Disposition: A | Discharge status: Home / Self Care | Payer: 59 | Attending: Urology | Admitting: Urology

## 2019-11-30 DIAGNOSIS — Z882 Allergy status to sulfonamides status: Secondary | ICD-10-CM | POA: Diagnosis not present

## 2019-11-30 DIAGNOSIS — N201 Calculus of ureter: Secondary | ICD-10-CM | POA: Diagnosis not present

## 2019-11-30 DIAGNOSIS — Z881 Allergy status to other antibiotic agents status: Secondary | ICD-10-CM | POA: Insufficient documentation

## 2019-11-30 DIAGNOSIS — Z888 Allergy status to other drugs, medicaments and biological substances status: Secondary | ICD-10-CM | POA: Diagnosis not present

## 2019-11-30 DIAGNOSIS — Z79899 Other long term (current) drug therapy: Secondary | ICD-10-CM | POA: Insufficient documentation

## 2019-11-30 HISTORY — PX: EXTRACORPOREAL SHOCK WAVE LITHOTRIPSY: SHX1557

## 2019-11-30 SURGERY — LITHOTRIPSY, ESWL
Anesthesia: Moderate Sedation | Laterality: Right

## 2019-11-30 MED ORDER — MIDAZOLAM HCL 2 MG/2ML IJ SOLN: 1.0000 mg | Freq: Once | INTRAMUSCULAR | Status: AC

## 2019-11-30 MED ORDER — HYDROMORPHONE HCL 1 MG/ML IJ SOLN
INTRAMUSCULAR | Status: AC
  Filled 2019-11-30: qty 1

## 2019-11-30 MED ORDER — DIPHENHYDRAMINE HCL 25 MG PO CAPS
ORAL_CAPSULE | ORAL | Status: AC
  Administered 2019-11-30: 25 mg via ORAL
  Filled 2019-11-30: qty 1

## 2019-11-30 MED ORDER — HYDROMORPHONE HCL 1 MG/ML IJ SOLN
1.0000 mg | Freq: Once | INTRAMUSCULAR | Status: AC
  Administered 2019-11-30: 1 mg via INTRAVENOUS

## 2019-11-30 MED ORDER — MORPHINE SULFATE (PF) 10 MG/ML IV SOLN
INTRAVENOUS | Status: AC
  Administered 2019-11-30: 10 mg via INTRAMUSCULAR
  Filled 2019-11-30: qty 1

## 2019-11-30 MED ORDER — SODIUM CHLORIDE 0.9 % IV SOLN
2.0000 g | Freq: Once | INTRAVENOUS | Status: AC
  Administered 2019-11-30: 2 g via INTRAVENOUS
  Filled 2019-11-30: qty 20

## 2019-11-30 MED ORDER — PROMETHAZINE HCL 25 MG/ML IJ SOLN
25.0000 mg | Freq: Four times a day (QID) | INTRAMUSCULAR | Status: DC | PRN
Start: 2019-11-30 — End: 2019-11-30

## 2019-11-30 MED ORDER — PROMETHAZINE HCL 25 MG/ML IJ SOLN
INTRAMUSCULAR | Status: AC
  Administered 2019-11-30: 25 mg via INTRAMUSCULAR
  Filled 2019-11-30: qty 1

## 2019-11-30 MED ORDER — DIPHENHYDRAMINE HCL 25 MG PO CAPS: 25.0000 mg | ORAL_CAPSULE | Freq: Once | ORAL | Status: AC

## 2019-11-30 MED ORDER — MORPHINE SULFATE (PF) 10 MG/ML IV SOLN: 10.0000 mg | Freq: Once | INTRAVENOUS | Status: AC

## 2019-11-30 MED ORDER — FUROSEMIDE 10 MG/ML IJ SOLN
INTRAMUSCULAR | Status: AC
  Filled 2019-11-30: qty 2

## 2019-11-30 MED ORDER — DEXTROSE-NACL 5-0.45 % IV SOLN: INTRAVENOUS | Status: DC

## 2019-11-30 MED ORDER — MIDAZOLAM HCL 2 MG/2ML IJ SOLN
INTRAMUSCULAR | Status: AC
  Administered 2019-11-30: 1 mg via INTRAVENOUS
  Filled 2019-11-30: qty 2

## 2019-11-30 MED ORDER — FUROSEMIDE 10 MG/ML IJ SOLN
10.0000 mg | Freq: Once | INTRAMUSCULAR | Status: AC
  Administered 2019-11-30: 10 mg via INTRAVENOUS

## 2019-11-30 NOTE — Discharge Instructions (Addendum)
Lithotripsy  Lithotripsy is a treatment that can sometimes help eliminate kidney stones and the pain that they cause. A form of lithotripsy, also known as extracorporeal shock wave lithotripsy, is a nonsurgical procedure that crushes a kidney stone with shock waves. These shock waves pass through your body and focus on the kidney stone. They cause the kidney stone to break up while it is still in the urinary tract. This makes it easier for the smaller pieces of stone to pass in the urine. Tell a health care provider about:  Any allergies you have.  All medicines you are taking, including vitamins, herbs, eye drops, creams, and over-the-counter medicines.  Any blood disorders you have.  Any surgeries you have had.  Any medical conditions you have.  Whether you are pregnant or may be pregnant.  Any problems you or family members have had with anesthetic medicines. What are the risks? Generally, this is a safe procedure. However, problems may occur, including:  Infection.  Bleeding of the kidney.  Bruising of the kidney or skin.  Scarring of the kidney, which can lead to: ? Increased blood pressure. ? Poor kidney function. ? Return (recurrence) of kidney stones.  Damage to other structures or organs, such as the liver, colon, spleen, or pancreas.  Blockage (obstruction) of the the tube that carries urine from the kidney to the bladder (ureter).  Failure of the kidney stone to break into pieces (fragments). What happens before the procedure? Staying hydrated Follow instructions from your health care provider about hydration, which may include:  Up to 2 hours before the procedure - you may continue to drink clear liquids, such as water, clear fruit juice, black coffee, and plain tea. Eating and drinking restrictions Follow instructions from your health care provider about eating and drinking, which may include:  8 hours before the procedure - stop eating heavy meals or foods  such as meat, fried foods, or fatty foods.  6 hours before the procedure - stop eating light meals or foods, such as toast or cereal.  6 hours before the procedure - stop drinking milk or drinks that contain milk.  2 hours before the procedure - stop drinking clear liquids. General instructions  Plan to have someone take you home from the hospital or clinic.  Ask your health care provider about: ? Changing or stopping your regular medicines. This is especially important if you are taking diabetes medicines or blood thinners. ? Taking medicines such as aspirin and ibuprofen. These medicines and other NSAIDs can thin your blood. Do not take these medicines for 7 days before your procedure if your health care provider instructs you not to.  You may have tests, such as: ? Blood tests. ? Urine tests. ? Imaging tests, such as a CT scan. What happens during the procedure?  To lower your risk of infection: ? Your health care team will wash or sanitize their hands. ? Your skin will be washed with soap.  An IV tube will be inserted into one of your veins. This tube will give you fluids and medicines.  You will be given one or more of the following: ? A medicine to help you relax (sedative). ? A medicine to make you fall asleep (general anesthetic).  A water-filled cushion may be placed behind your kidney or on your abdomen. In some cases you may be placed in a tub of lukewarm water.  Your body will be positioned in a way that makes it easy to target the kidney   stone.  A flexible tube with holes in it (stent) may be placed in the ureter. This will help keep urine flowing from the kidney if the fragments of the stone have been blocking the ureter.  An X-ray or ultrasound exam will be done to locate your stone.  Shock waves will be aimed at the stone. If you are awake, you may feel a tapping sensation as the shock waves pass through your body. The procedure may vary among health care  providers and hospitals. What happens after the procedure?  You may have an X-ray to see whether the procedure was able to break up the kidney stone and how much of the stone has passed. If large stone fragments remain after treatment, you may need to have a second procedure at a later time.  Your blood pressure, heart rate, breathing rate, and blood oxygen level will be monitored until the medicines you were given have worn off.  You may be given antibiotics or pain medicine as needed.  If a stent was placed in your ureter during surgery, it may stay in place for a few weeks.  You may need strain your urine to collect pieces of the kidney stone for testing.  You will need to drink plenty of water.  Do not drive for 24 hours if you were given a sedative. Summary  Lithotripsy is a treatment that can sometimes help eliminate kidney stones and the pain that they cause.  A form of lithotripsy, also known as extracorporeal shock wave lithotripsy, is a nonsurgical procedure that crushes a kidney stone with shock waves.  Generally, this is a safe procedure. However, problems may occur, including damage to the kidney or other organs, infection, or obstruction of the tube that carries urine from the kidney to the bladder (ureter).  When you go home, you will need to drink plenty of water. You may be asked to strain your urine to collect pieces of the kidney stone for testing. This information is not intended to replace advice given to you by your health care provider. Make sure you discuss any questions you have with your health care provider. Document Revised: 10/10/2018 Document Reviewed: 05/20/2016 Elsevier Patient Education  2020 [place] This sheet gives you information about how to care for yourself after your procedure. Your health care provider may also give you more specific instructions. If you have problems or questions, contact your health care  provider. What can I expect after the procedure? After the procedure, it is common to have:  Some blood in your urine. This should only last for a few days.  Soreness in your back, sides, or upper abdomen for a few days.  Blotches or bruises on your back where the pressure wave entered the skin.  Pain, discomfort, or nausea when pieces (fragments) of the kidney stone move through the tube that carries urine from the kidney to the bladder (ureter). Stone fragments may pass soon after the procedure, but they may continue to pass for up to 4-8 weeks. ? If you have severe pain or nausea, contact your health care provider. This may be caused by a large stone that was not broken up, and this may mean that you need more treatment.  Some pain or discomfort during urination.  Some pain or discomfort in the lower abdomen or (in men) at the base of the penis. Follow these instructions at home: Medicines  Take over-the-counter and prescription medicines only as told by your health  care provider.  If you were prescribed an antibiotic medicine, take it as told by your health care provider. Do not stop taking the antibiotic even if you start to feel better.  Do not drive for 24 hours if you were given a medicine to help you relax (sedative).  Do not drive or use heavy machinery while taking prescription pain medicine. Eating and drinking      Drink enough water and fluids to keep your urine clear or pale yellow. This helps any remaining pieces of the stone to pass. It can also help prevent new stones from forming.  Eat plenty of fresh fruits and vegetables.  Follow instructions from your health care provider about eating and drinking restrictions. You may be instructed: ? To reduce how much salt (sodium) you eat or drink. Check ingredients and nutrition facts on packaged foods and beverages. ? To reduce how much meat you eat.  Eat the recommended amount of calcium for your age and gender. Ask  your health care provider how much calcium you should have. General instructions  Get plenty of rest.  Most people can resume normal activities 1-2 days after the procedure. Ask your health care provider what activities are safe for you.  Your health care provider may direct you to lie in a certain position (postural drainage) and tap firmly (percuss) over your kidney area to help stone fragments pass. Follow instructions as told by your health care provider.  If directed, strain all urine through the strainer that was provided by your health care provider. ? Keep all fragments for your health care provider to see. Any stones that are found may be sent to a medical lab for examination. The stone may be as small as a grain of salt.  Keep all follow-up visits as told by your health care provider. This is important. Contact a health care provider if:  You have pain that is severe or does not get better with medicine.  You have nausea that is severe or does not go away.  You have blood in your urine longer than your health care provider told you to expect.  You have more blood in your urine.  You have pain during urination that does not go away.  You urinate more frequently than usual and this does not go away.  You develop a rash or any other possible signs of an allergic reaction. Get help right away if:  You have severe pain in your back, sides, or upper abdomen.  You have severe pain while urinating.  Your urine is very dark red.  You have blood in your stool (feces).  You cannot pass any urine at all.  You feel a strong urge to urinate after emptying your bladder.  You have a fever or chills.  You develop shortness of breath, difficulty breathing, or chest pain.  You have severe nausea that leads to persistent vomiting.  You faint. Summary  After this procedure, it is common to have some pain, discomfort, or nausea when pieces (fragments) of the kidney stone move  through the tube that carries urine from the kidney to the bladder (ureter). If this pain or nausea is severe, however, you should contact your health care provider.  Most people can resume normal activities 1-2 days after the procedure. Ask your health care provider what activities are safe for you.  Drink enough water and fluids to keep your urine clear or pale yellow. This helps any remaining pieces of the stone to  pass, and it can help prevent new stones from forming.  If directed, strain your urine and keep all fragments for your health care provider to see. Fragments or stones may be as small as a grain of salt.  Get help right away if you have severe pain in your back, sides, or upper abdomen or have severe pain while urinating. This information is not intended to replace advice given to you by your health care provider. Make sure you discuss any questions you have with your health care provider. Document Revised: 10/10/2018 Document Reviewed: 05/20/2016 Elsevier Patient Education  2020 [place]   1) The drugs that you were given will stay in your system until tomorrow so for the next 24 hours you should not:  A) Drive an automobile B) Make any legal decisions C) Drink any alcoholic beverage   2) You may resume regular meals tomorrow.  Today it is better to start with liquids and gradually work up to solid foods.  You may eat anything you prefer, but it is better to start with liquids, then soup and crackers, and gradually work up to solid foods.   3) Please notify your doctor immediately if you have any unusual bleeding, trouble breathing, redness and pain at the surgery site, drainage, fever, or pain not relieved by medication.    4) Additional Instructions:   FOLLOW [facility] DISCHARGE INSTRUCTION SHEET AS REVIEWED.        Please contact your physician with any problems or Same Day Surgery at [phone],  Monday through Friday 6 am to 4 pm, or [place] at [facility] number at [phone].

## 2020-04-17 DIAGNOSIS — U071 COVID-19: Secondary | ICD-10-CM

## 2020-04-17 HISTORY — DX: COVID-19: U07.1

## 2020-04-26 ENCOUNTER — Inpatient Hospital Stay
Admission: EM | Admit: 2020-04-26 | Discharge: 2020-04-28 | DRG: 177 | Discharge status: Against Medical Advice | Payer: 59 | Attending: Internal Medicine | Admitting: Internal Medicine

## 2020-04-26 ENCOUNTER — Other Ambulatory Visit: Payer: Self-pay

## 2020-04-26 ENCOUNTER — Emergency Department: Payer: 59

## 2020-04-26 ENCOUNTER — Encounter: Payer: Self-pay | Admitting: Internal Medicine

## 2020-04-26 DIAGNOSIS — Z79899 Other long term (current) drug therapy: Secondary | ICD-10-CM | POA: Diagnosis not present

## 2020-04-26 DIAGNOSIS — Z882 Allergy status to sulfonamides status: Secondary | ICD-10-CM

## 2020-04-26 DIAGNOSIS — R0902 Hypoxemia: Secondary | ICD-10-CM | POA: Diagnosis present

## 2020-04-26 DIAGNOSIS — U071 COVID-19: Secondary | ICD-10-CM | POA: Diagnosis not present

## 2020-04-26 DIAGNOSIS — J1282 Pneumonia due to coronavirus disease 2019: Secondary | ICD-10-CM | POA: Diagnosis present

## 2020-04-26 DIAGNOSIS — J9601 Acute respiratory failure with hypoxia: Secondary | ICD-10-CM | POA: Diagnosis present

## 2020-04-26 DIAGNOSIS — I10 Essential (primary) hypertension: Secondary | ICD-10-CM | POA: Diagnosis present

## 2020-04-26 DIAGNOSIS — Z888 Allergy status to other drugs, medicaments and biological substances status: Secondary | ICD-10-CM

## 2020-04-26 DIAGNOSIS — J9600 Acute respiratory failure, unspecified whether with hypoxia or hypercapnia: Secondary | ICD-10-CM | POA: Diagnosis not present

## 2020-04-26 DIAGNOSIS — Z79890 Hormone replacement therapy (postmenopausal): Secondary | ICD-10-CM

## 2020-04-26 DIAGNOSIS — Z5329 Procedure and treatment not carried out because of patient's decision for other reasons: Secondary | ICD-10-CM | POA: Diagnosis not present

## 2020-04-26 LAB — CBC WITH DIFFERENTIAL/PLATELET
Abs Immature Granulocytes: 0.05 10*3/uL (ref 0.00–0.07)
Basophils Absolute: 0 10*3/uL (ref 0.0–0.1)
Basophils Relative: 0 %
Eosinophils Absolute: 0 10*3/uL (ref 0.0–0.5)
Eosinophils Relative: 0 %
HCT: 39.5 % (ref 36.0–46.0)
Hemoglobin: 12.9 g/dL (ref 12.0–15.0)
Immature Granulocytes: 1 %
Lymphocytes Relative: 13 %
Lymphs Abs: 1 10*3/uL (ref 0.7–4.0)
MCH: 28.3 pg (ref 26.0–34.0)
MCHC: 32.7 g/dL (ref 30.0–36.0)
MCV: 86.6 fL (ref 80.0–100.0)
Monocytes Absolute: 0.3 10*3/uL (ref 0.1–1.0)
Monocytes Relative: 4 %
Neutro Abs: 6.4 10*3/uL (ref 1.7–7.7)
Neutrophils Relative %: 82 %
Platelets: 320 10*3/uL (ref 150–400)
RBC: 4.56 MIL/uL (ref 3.87–5.11)
RDW: 13.1 % (ref 11.5–15.5)
WBC: 7.8 10*3/uL (ref 4.0–10.5)
nRBC: 0 % (ref 0.0–0.2)

## 2020-04-26 LAB — BASIC METABOLIC PANEL
Anion gap: 12 (ref 5–15)
BUN: 18 mg/dL (ref 8–23)
CO2: 27 mmol/L (ref 22–32)
Calcium: 8.6 mg/dL — ABNORMAL LOW (ref 8.9–10.3)
Chloride: 95 mmol/L — ABNORMAL LOW (ref 98–111)
Creatinine, Ser: 0.97 mg/dL (ref 0.44–1.00)
GFR, Estimated: >60 mL/min (ref >60)
Glucose, Bld: 112 mg/dL — ABNORMAL HIGH (ref 70–99)
Potassium: 4.1 mmol/L (ref 3.5–5.1)
Sodium: 134 mmol/L — ABNORMAL LOW (ref 135–145)

## 2020-04-26 MED ORDER — HYDROCOD POLST-CPM POLST ER 10-8 MG/5ML PO SUER
5.0000 mL | Freq: Two times a day (BID) | ORAL | Status: DC | PRN
  Filled 2020-04-26: qty 5

## 2020-04-26 MED ORDER — ACETAMINOPHEN 325 MG PO TABS
650.0000 mg | ORAL_TABLET | Freq: Once | ORAL | Status: AC
  Administered 2020-04-26: 650 mg via ORAL
  Filled 2020-04-26: qty 2

## 2020-04-26 MED ORDER — LISINOPRIL 20 MG PO TABS
20.0000 mg | ORAL_TABLET | Freq: Every day | ORAL | Status: DC
  Administered 2020-04-27 – 2020-04-28 (×2): 20 mg via ORAL
  Filled 2020-04-26: qty 1
  Filled 2020-04-26: qty 2

## 2020-04-26 MED ORDER — ONDANSETRON HCL 4 MG/2ML IJ SOLN: 4.0000 mg | Freq: Four times a day (QID) | INTRAMUSCULAR | Status: DC | PRN

## 2020-04-26 MED ORDER — ONDANSETRON HCL 4 MG PO TABS: 4.0000 mg | ORAL_TABLET | Freq: Four times a day (QID) | ORAL | Status: DC | PRN

## 2020-04-26 MED ORDER — DEXAMETHASONE SODIUM PHOSPHATE 10 MG/ML IJ SOLN
6.0000 mg | INTRAMUSCULAR | Status: DC
  Administered 2020-04-27: 17:00:00 6 mg via INTRAVENOUS
  Filled 2020-04-26: qty 1

## 2020-04-26 MED ORDER — DEXAMETHASONE SODIUM PHOSPHATE 10 MG/ML IJ SOLN
10.0000 mg | Freq: Once | INTRAMUSCULAR | Status: AC
  Administered 2020-04-26: 10 mg via INTRAVENOUS
  Filled 2020-04-26: qty 1

## 2020-04-26 MED ORDER — ZINC SULFATE 220 (50 ZN) MG PO CAPS
220.0000 mg | ORAL_CAPSULE | Freq: Every day | ORAL | Status: DC
  Administered 2020-04-26 – 2020-04-28 (×3): 220 mg via ORAL
  Filled 2020-04-26 (×3): qty 1

## 2020-04-26 MED ORDER — SODIUM CHLORIDE 0.9 % IV SOLN
100.0000 mg | Freq: Every day | INTRAVENOUS | Status: DC
  Filled 2020-04-26: qty 20

## 2020-04-26 MED ORDER — SODIUM CHLORIDE 0.9 % IV SOLN: 200.0000 mg | Freq: Once | INTRAVENOUS | Status: DC

## 2020-04-26 MED ORDER — SODIUM CHLORIDE 0.9 % IV SOLN
200.0000 mg | Freq: Once | INTRAVENOUS | Status: AC
  Administered 2020-04-26: 200 mg via INTRAVENOUS
  Filled 2020-04-26: qty 200

## 2020-04-26 MED ORDER — ALBUTEROL SULFATE HFA 108 (90 BASE) MCG/ACT IN AERS
2.0000 | INHALATION_SPRAY | Freq: Four times a day (QID) | RESPIRATORY_TRACT | Status: DC
  Administered 2020-04-27 – 2020-04-28 (×5): 2 via RESPIRATORY_TRACT
  Filled 2020-04-26: qty 6.7

## 2020-04-26 MED ORDER — SODIUM CHLORIDE 0.9 % IV SOLN: 100.0000 mg | Freq: Every day | INTRAVENOUS | Status: DC

## 2020-04-26 MED ORDER — LABETALOL HCL 5 MG/ML IV SOLN: 5.0000 mg | INTRAVENOUS | Status: DC | PRN

## 2020-04-26 MED ORDER — GUAIFENESIN-DM 100-10 MG/5ML PO SYRP
10.0000 mL | ORAL_SOLUTION | ORAL | Status: DC | PRN
  Administered 2020-04-27: 22:00:00 10 mL via ORAL
  Filled 2020-04-26 (×2): qty 10

## 2020-04-26 MED ORDER — ADULT MULTIVITAMIN W/MINERALS CH
1.0000 | ORAL_TABLET | Freq: Every day | ORAL | Status: DC
  Administered 2020-04-26 – 2020-04-28 (×3): 1 via ORAL
  Filled 2020-04-26 (×3): qty 1

## 2020-04-26 MED ORDER — ENOXAPARIN SODIUM 40 MG/0.4ML ~~LOC~~ SOLN
40.0000 mg | SUBCUTANEOUS | Status: DC
  Administered 2020-04-27: 40 mg via SUBCUTANEOUS
  Filled 2020-04-26: qty 0.4

## 2020-04-26 MED ORDER — ASCORBIC ACID 500 MG PO TABS
500.0000 mg | ORAL_TABLET | Freq: Every day | ORAL | Status: DC
  Administered 2020-04-26 – 2020-04-28 (×3): 500 mg via ORAL
  Filled 2020-04-26 (×3): qty 1

## 2020-04-26 MED ORDER — ACETAMINOPHEN 325 MG PO TABS
650.0000 mg | ORAL_TABLET | Freq: Four times a day (QID) | ORAL | Status: DC | PRN
  Administered 2020-04-27 (×2): 650 mg via ORAL
  Filled 2020-04-26 (×2): qty 2

## 2020-04-26 NOTE — ED Provider Notes (Signed)
[facility] Emergency Department Provider Note   ____________________________________________   I have reviewed the triage vital signs and the nursing notes.   HISTORY  Chief Complaint Cough (covid positive 04/17/20)   History limited by: Not Limited   HPI Rachel Dickson is a 64 y.o. female who presents to the emergency department today because of concerns for shortness of breath in the setting of Covid.  Patient states she tested positive for Covid 9 days ago and symptoms started 11 days ago.  Patient has been feeling recently short of breath of the past few days.  She denies any history of previous lung disease.  The patient has not yet had her vaccine.  Records reviewed. Per medical record review patient has a history of anxiety, hypertension.  Past Medical History:  Diagnosis Date   Allergy    Anxiety    Hypertension     Patient Active Problem List   Diagnosis Date Noted   Routine general medical examination at a health care facility 08/07/2012   Alopecia areata 08/07/2012   Obesity (BMI 30-39.9) 08/07/2012   Encounter for screening for infections with predominantly sexual mode of transmission 03/22/2011    Past Surgical History:  Procedure Laterality Date   ABDOMINAL HYSTERECTOMY  2004   EXTRACORPOREAL SHOCK WAVE LITHOTRIPSY Right 11/30/2019   Procedure: EXTRACORPOREAL SHOCK WAVE LITHOTRIPSY (ESWL);  Surgeon: Orson Ape, MD;  Location: [facility] ORS;  Service: Urology;  Laterality: Right;   LITHOTRIPSY  Nov 2008   [facility]    Prior to Admission medications   Medication Sig Start Date End Date Taking? Authorizing Provider  estradiol (CLIMARA - DOSED IN MG/24 HR) 0.05 mg/24hr patch Place 0.05 mg onto the skin once a week. 10/21/19   [provider]  hydrochlorothiazide (HYDRODIURIL) 12.5 MG tablet Take 12.5 mg by mouth every other day. 07/28/19 07/27/20  [provider]  lisinopril (ZESTRIL) 20 MG tablet  Take 20 mg by mouth daily. 11/04/19   [provider]    Allergies Ciprofloxacin, Chlorhexidine, Erythromycin, Macrolides and ketolides, Sulfa drugs cross reactors, and Chloral hydrate  Family History  Problem Relation Age of Onset   Cancer Neg Hx     Social History Social History   Tobacco Use   Smoking status: Never Smoker   Smokeless tobacco: Never Used  Substance Use Topics   Alcohol use: Yes    Comment: occasional   Drug use: No    Review of Systems Constitutional: No fever/chills Eyes: No visual changes. ENT: No sore throat. Cardiovascular: Denies chest pain. Respiratory: Positive for shortness of breath. Positive for cough. Gastrointestinal: No abdominal pain.  No nausea, no vomiting.  No diarrhea.   Genitourinary: Negative for dysuria. Musculoskeletal: Positive for body aches. Skin: Negative for rash. Neurological: Negative for headaches, focal weakness or numbness.  ____________________________________________   PHYSICAL EXAM:  VITAL SIGNS: ED Triage Vitals  Enc Vitals Group     BP 04/26/20 1904 (!) 151/86     Pulse Rate 04/26/20 1904 93     Resp 04/26/20 1904 (!) 24     Temp 04/26/20 1904 99 F (37.2 C)     Temp Source 04/26/20 1904 Oral     SpO2 04/26/20 1900 94 %     Weight 04/26/20 1906 177 lb (80.3 kg)     Height 04/26/20 1906 5\' 2"  (1.575 m)   Constitutional: Alert and oriented.  Eyes: Conjunctivae are normal.  ENT      Head: Normocephalic and atraumatic.  Nose: No congestion/rhinnorhea.      Mouth/Throat: Mucous membranes are moist.      Neck: No stridor. Hematological/Lymphatic/Immunilogical: No cervical lymphadenopathy. Cardiovascular: Normal rate, regular rhythm.  No murmurs, rubs, or gallops.  Respiratory: Increased respiratory rate, tachypnea.  Gastrointestinal: Soft and non tender. No rebound. No guarding.  Genitourinary: Deferred Musculoskeletal: Normal range of motion in all extremities. No lower extremity  edema. Neurologic:  Normal speech and language. No gross focal neurologic deficits are appreciated.  Skin:  Skin is warm, dry and intact. No rash noted. Psychiatric: Mood and affect are normal. Speech and behavior are normal. Patient exhibits appropriate insight and judgment.  ____________________________________________    LABS (pertinent positives/negatives)  BMP na 134, k 4.1, cl 95, glu 112, cr 0.97 CBC wbc 7.8, hgb 12.9, plt 320  ____________________________________________   EKG  None  ____________________________________________    RADIOLOGY  CXR Patchy opacities consistent with covid  ____________________________________________   PROCEDURES  Procedures  ____________________________________________   INITIAL IMPRESSION / ASSESSMENT AND PLAN / ED COURSE  Pertinent labs & imaging results that were available during my care of the patient were reviewed by me and considered in my medical decision making (see chart for details).   Patient presented to the emergency department today because of concerns for shortness of breath in the setting of Covid diagnosis.  Patient was found to be hypoxic on room air. Will order decadron and remdesivir. Will plan on admission. Discussed findings and plan with patient.    ___________________________________________   FINAL CLINICAL IMPRESSION(S) / ED DIAGNOSES  Final diagnoses:  COVID-19  Hypoxia     Note: This dictation was prepared with Dragon dictation. Any transcriptional errors that result from this process are unintentional     Phineas Semen, MD 04/26/20 2355

## 2020-04-26 NOTE — ED Triage Notes (Signed)
Pt reports testing positive for covid 04/17/20, symptoms started 04/15/20.  Began feeling increasingly SOB, worsening yesterday.

## 2020-04-26 NOTE — ED Notes (Signed)
Patient provided with 2 warm blankets per request. Pt in bed with bed low and locked and side rails raised x2. Call bell in reach and cardiac monitor in place. Pt provided with TV remote and assisted with turning on TV. Pt breathing with symmetric chest rise and fall and speaking in full sentences. RN to continue to monitor.

## 2020-04-26 NOTE — ED Notes (Signed)
Patient requested information packet about Remdesivir infusion ordered prior to consenting to receiving infusion. Packet printed from [employer] and provided to pt to review.

## 2020-04-26 NOTE — H&P (Addendum)
History and Physical    Rachel Dickson OHY:[id number] DOB: October 19, 1955 DOA: 04/26/2020  PCP: Barbette Reichmann, MD   Patient coming from: Home  I have personally briefly reviewed patient's old medical records in [facility]  Chief Complaint: Shortness of breath, Covid + 04/17/2020  HPI: Rachel Dickson is a 64 y.o. female with history of hypertension diagnosed with Covid on 04/17/2020 with symptom onset 10/4 who had usual Covid symptoms but then started Developing progressively worsening shortness of breath over the past few days.  She is unvaccinated against Covid.  She was apparently referred for monoclonal antibodies when she was diagnosed but then declined.  She has a cough.  Denies fever, nausea vomiting or diarrhea.  Denies chest pain.  On arrival of EMS O2 sat reportedly in the 70s.  ED Course: Upon arrival she had a low-grade temperature of 99 and was tachypneic at 24 saturating at 96 on 5 L O2.  Chest x-ray showed multifocal pneumonia.  Patient started on remdesivir and Decadron.  Hospitalist consulted for admission.   Review of Systems: As per HPI otherwise all other systems on review of systems negative.    Past Medical History:  Diagnosis Date  . Allergy   . Anxiety   . Hypertension     Past Surgical History:  Procedure Laterality Date  . ABDOMINAL HYSTERECTOMY  2004  . EXTRACORPOREAL SHOCK WAVE LITHOTRIPSY Right 11/30/2019   Procedure: EXTRACORPOREAL SHOCK WAVE LITHOTRIPSY (ESWL);  Surgeon: Orson Ape, MD;  Location: [facility] ORS;  Service: Urology;  Laterality: Right;  . LITHOTRIPSY  Nov 2008   [facility]     reports that she has never smoked. She has never used smokeless tobacco. She reports current alcohol use. She reports that she does not use drugs.  Allergies  Allergen Reactions  . Ciprofloxacin Other (See Comments)    Never wants this medication, sister was prescribed this and developed Glynn Octave  . Chlorhexidine Other (See  Comments)    Skin burning  . Erythromycin Swelling  . Macrolides And Ketolides   . Sulfa Drugs Cross Reactors Hives  . Chloral Hydrate Rash    Family History  Problem Relation Age of Onset  . Cancer Neg Hx       Prior to Admission medications   Medication Sig Start Date End Date Taking? Authorizing Provider  benzonatate (TESSALON) 200 MG capsule Take 200 mg by mouth 3 (three) times daily as needed for cough. 04/19/20  Yes [provider]  predniSONE (DELTASONE) 10 MG tablet Take 10-30 mg by mouth as directed. Take 3 tablets (30 mg total) by mouth once daily for 3 days, THEN 2 tablets (20 mg total) once daily for 3 days, THEN 1 tablet (10 mg total) once daily for 3 days. 04/19/20 04/28/20 Yes [provider]  estradiol (CLIMARA - DOSED IN MG/24 HR) 0.05 mg/24hr patch Place 0.05 mg onto the skin once a week. 10/21/19   [provider]  hydrochlorothiazide (HYDRODIURIL) 12.5 MG tablet Take 12.5 mg by mouth every other day. 07/28/19 07/27/20  [provider]  lisinopril (ZESTRIL) 20 MG tablet Take 20 mg by mouth daily. 11/04/19   [provider]    Physical Exam: Vitals:   04/26/20 1904 04/26/20 1906 04/26/20 1930 04/26/20 2000  BP: (!) 151/86  125/81 126/81  Pulse: 93  89 88  Resp: (!) 24  (!) 23 19  Temp: 99 F (37.2 C)     TempSrc: Oral     SpO2: 96%  96% 95%  Weight:  80.3 kg    Height:  5\' 2"  (1.575 m)       Vitals:   04/26/20 1904 04/26/20 1906 04/26/20 1930 04/26/20 2000  BP: (!) 151/86  125/81 126/81  Pulse: 93  89 88  Resp: (!) 24  (!) 23 19  Temp: 99 F (37.2 C)     TempSrc: Oral     SpO2: 96%  96% 95%  Weight:  80.3 kg    Height:  5\' 2"  (1.575 m)        Constitutional: Alert and oriented x 3 .  Conversational dyspnea, with increased work of breathing with minimal exertion in bed HEENT:      Head: Normocephalic and atraumatic.         Eyes: PERLA, EOMI, Conjunctivae are normal. Sclera is non-icteric.        Mouth/Throat: Mucous membranes are moist.       Neck: Supple with no signs of meningismus. Cardiovascular: Regular rate and rhythm. No murmurs, gallops, or rubs. 2+ symmetrical distal pulses are present . No JVD. No LE edema Respiratory: Respiratory effort increased with coarse breath sounds in all lung fields Gastrointestinal: Soft, non tender, and non distended with positive bowel sounds. No rebound or guarding. Genitourinary: No CVA tenderness. Musculoskeletal: Nontender with normal range of motion in all extremities. No cyanosis, or erythema of extremities. Neurologic:  Face is symmetric. Moving all extremities. No gross focal neurologic deficits . Skin: Skin is warm, dry.  No rash or ulcers Psychiatric: Mood and affect are normal    Labs on Admission: I have personally reviewed following labs and imaging studies  CBC: Recent Labs  Lab 04/26/20 2052  WBC 7.8  NEUTROABS 6.4  HGB 12.9  HCT 39.5  MCV 86.6  PLT 320   Basic Metabolic Panel: Recent Labs  Lab 04/26/20 2052  NA 134*  K 4.1  CL 95*  CO2 27  GLUCOSE 112*  BUN 18  CREATININE 0.97  CALCIUM 8.6*   GFR: Estimated Creatinine Clearance: 58.3 mL/min (by C-G formula based on SCr of 0.97 mg/dL). Liver Function Tests: No results for input(s): AST, ALT, ALKPHOS, BILITOT, PROT, ALBUMIN in the last 168 hours. No results for input(s): LIPASE, AMYLASE in the last 168 hours. No results for input(s): AMMONIA in the last 168 hours. Coagulation Profile: No results for input(s): INR, PROTIME in the last 168 hours. Cardiac Enzymes: No results for input(s): CKTOTAL, CKMB, CKMBINDEX, TROPONINI in the last 168 hours. BNP (last 3 results) No results for input(s): PROBNP in the last 8760 hours. HbA1C: No results for input(s): HGBA1C in the last 72 hours. CBG: No results for input(s): GLUCAP in the last 168 hours. Lipid Profile: No results for input(s): CHOL, HDL, LDLCALC, TRIG, CHOLHDL, LDLDIRECT in the last 72 hours. Thyroid  Function Tests: No results for input(s): TSH, T4TOTAL, FREET4, T3FREE, THYROIDAB in the last 72 hours. Anemia Panel: No results for input(s): VITAMINB12, FOLATE, FERRITIN, TIBC, IRON, RETICCTPCT in the last 72 hours. Urine analysis:    Component Value Date/Time   COLORURINE YELLOW (A) 11/27/2019 0542   APPEARANCEUR HAZY (A) 11/27/2019 0542   LABSPEC 1.008 11/27/2019 0542   PHURINE 5.0 11/27/2019 0542   GLUCOSEU NEGATIVE 11/27/2019 0542   HGBUR MODERATE (A) 11/27/2019 0542   BILIRUBINUR NEGATIVE 11/27/2019 0542   KETONESUR NEGATIVE 11/27/2019 0542   PROTEINUR NEGATIVE 11/27/2019 0542   UROBILINOGEN 0.2 03/20/2011 1621   NITRITE NEGATIVE 11/27/2019 0542   LEUKOCYTESUR NEGATIVE 11/27/2019 0542  Radiological Exams on Admission: No results found.   Assessment/Plan 64 year old female with no significant past medical history, diagnosed with Covid on 10/6 with symptom onset 10/4 presenting with 2-day history of worsening shortness of breath.  Unvaccinated.  Declined monoclonal antibodies    Pneumonia due to COVID-19 virus   Acute respiratory failure due to COVID-19 [facility]) -Patient with O2 sat in the 70s on room air reported by EMS, now saturating in the mid 90s on 5 L.  Tachypneic with increased work of breathing unable to speak in complete sentences -Remdesivir, Decadron, albuterol, antitussives and vitamins -Proning as tolerated -Follow inflammatory biomarkers  Essential hypertension -continue home lisinopril -Labetalol as needed SBP over 180    DVT prophylaxis: Lovenox  Code Status: full code  Family Communication:  none  Disposition Plan: Back to previous home environment Consults called: none  Status:At the time of admission, it appears that the appropriate admission status for this patient is INPATIENT. This is judged to be reasonable and necessary in order to provide the required intensity of service to ensure the patient's safety given the presenting symptoms, physical  exam findings, and initial radiographic and laboratory data in the context of their  Comorbid conditions.   Patient requires inpatient status due to high intensity of service, high risk for further deterioration and high frequency of surveillance required.   I certify that at the point of admission it is my clinical judgment that the patient will require inpatient hospital care spanning beyond 2 midnights     Andris Baumann MD [facility]     04/26/2020, 9:26 PM

## 2020-04-26 NOTE — Consult Note (Addendum)
Remdesivir - Pharmacy Brief Note   A/P:  Per notes,"Pt reports testing positive for covid 04/17/20, symptoms started 04/15/20.  Began feeling increasingly SOB, worsening yesterday".  Remdesivir 200 mg IVPB once followed by 100 mg IVPB daily x 4 days.   Cephus Shelling, PharmD Clinical Pharmacist  04/26/2020 8:59 PM

## 2020-04-27 DIAGNOSIS — J1282 Pneumonia due to Coronavirus disease 2019: Secondary | ICD-10-CM | POA: Diagnosis not present

## 2020-04-27 DIAGNOSIS — U071 COVID-19: Secondary | ICD-10-CM | POA: Diagnosis not present

## 2020-04-27 DIAGNOSIS — J9600 Acute respiratory failure, unspecified whether with hypoxia or hypercapnia: Secondary | ICD-10-CM | POA: Diagnosis not present

## 2020-04-27 LAB — CBC WITH DIFFERENTIAL/PLATELET
Abs Immature Granulocytes: 0.03 10*3/uL (ref 0.00–0.07)
Basophils Absolute: 0 10*3/uL (ref 0.0–0.1)
Basophils Relative: 0 %
Eosinophils Absolute: 0 10*3/uL (ref 0.0–0.5)
Eosinophils Relative: 0 %
HCT: 39.6 % (ref 36.0–46.0)
Hemoglobin: 13.1 g/dL (ref 12.0–15.0)
Immature Granulocytes: 1 %
Lymphocytes Relative: 10 %
Lymphs Abs: 0.6 10*3/uL — ABNORMAL LOW (ref 0.7–4.0)
MCH: 28.5 pg (ref 26.0–34.0)
MCHC: 33.1 g/dL (ref 30.0–36.0)
MCV: 86.3 fL (ref 80.0–100.0)
Monocytes Absolute: 0.1 10*3/uL (ref 0.1–1.0)
Monocytes Relative: 2 %
Neutro Abs: 5.2 10*3/uL (ref 1.7–7.7)
Neutrophils Relative %: 87 %
Platelets: 309 10*3/uL (ref 150–400)
RBC: 4.59 MIL/uL (ref 3.87–5.11)
RDW: 12.9 % (ref 11.5–15.5)
WBC: 5.9 10*3/uL (ref 4.0–10.5)
nRBC: 0 % (ref 0.0–0.2)

## 2020-04-27 LAB — COMPREHENSIVE METABOLIC PANEL
ALT: 80 U/L — ABNORMAL HIGH (ref 0–44)
AST: 93 U/L — ABNORMAL HIGH (ref 15–41)
Albumin: 2.9 g/dL — ABNORMAL LOW (ref 3.5–5.0)
Alkaline Phosphatase: 193 U/L — ABNORMAL HIGH (ref 38–126)
Anion gap: 11 (ref 5–15)
BUN: 16 mg/dL (ref 8–23)
CO2: 27 mmol/L (ref 22–32)
Calcium: 8.7 mg/dL — ABNORMAL LOW (ref 8.9–10.3)
Chloride: 99 mmol/L (ref 98–111)
Creatinine, Ser: 0.89 mg/dL (ref 0.44–1.00)
GFR, Estimated: >60 mL/min (ref >60)
Glucose, Bld: 155 mg/dL — ABNORMAL HIGH (ref 70–99)
Potassium: 4.3 mmol/L (ref 3.5–5.1)
Sodium: 137 mmol/L (ref 135–145)
Total Bilirubin: 0.5 mg/dL (ref 0.3–1.2)
Total Protein: 7.1 g/dL (ref 6.5–8.1)

## 2020-04-27 LAB — FIBRIN DERIVATIVES D-DIMER (ARMC ONLY): Fibrin derivatives D-dimer (ARMC): 765.74 ng/mL (FEU) — ABNORMAL HIGH (ref 0.00–499.00)

## 2020-04-27 LAB — C-REACTIVE PROTEIN: CRP: 17.3 mg/dL — ABNORMAL HIGH (ref <1.0)

## 2020-04-27 LAB — HIV ANTIBODY (ROUTINE TESTING W REFLEX): HIV Screen 4th Generation wRfx: NONREACTIVE

## 2020-04-27 LAB — FERRITIN: Ferritin: 116 ng/mL (ref 11–307)

## 2020-04-27 MED ORDER — ENOXAPARIN SODIUM 40 MG/0.4ML ~~LOC~~ SOLN: 0.5000 mg/kg | SUBCUTANEOUS | Status: DC

## 2020-04-27 MED ORDER — INFLUENZA VAC SPLIT QUAD 0.5 ML IM SUSY
0.5000 mL | PREFILLED_SYRINGE | INTRAMUSCULAR | Status: DC
  Filled 2020-04-27: qty 0.5

## 2020-04-27 NOTE — Progress Notes (Signed)
PROGRESS NOTE    Rachel Dickson  ZHY:[id number] DOB: 07/17/1955 DOA: 04/26/2020 PCP: Barbette Reichmann, MD    Brief Narrative:  Rachel Dickson is a 64 y.o. female with history of hypertension diagnosed with Covid on 04/17/2020 with symptom onset 10/4 who had usual Covid symptoms but then started developing progressively worsening shortness of breath over the past few days.  She is unvaccinated against Covid.  She was apparently referred for monoclonal antibodies when she was diagnosed but then declined. Received dose Remdesivir but declined after this. Wants to think about it.  Cxr-Patchy airspace opacity consistent with the given clinical history of COVID-19 positivity.  10/16-Discussed Remdesivir and other medications used for covid, encouraged her to think about these medication. Pt wants to think about it .   Consultants:     Procedures:   Antimicrobials:       Subjective: Pt appears anxious. Does not feel her sob is worse today, states doing better. No cp  Objective: Vitals:   04/27/20 0430 04/27/20 0540 04/27/20 0600 04/27/20 0821  BP:  (!) 137/96 (!) 135/94 139/88  Pulse: 63 81 77 82  Resp: (!) 25 18 (!) 24 (!) 22  Temp:    98.1 F (36.7 C)  TempSrc:    Oral  SpO2: 95% 94% (!) 88% 92%  Weight:      Height:        Intake/Output Summary (Last 24 hours) at 04/27/2020 0915 Last data filed at 04/27/2020 0026 Gross per 24 hour  Intake 250 ml  Output --  Net 250 ml   Filed Weights   04/26/20 1906  Weight: 80.3 kg    Examination:  General exam: non tachypnic, comfortable Respiratory system: b/l coarse bs. No wheezing Cardiovascular system: S1 & S2 heard, RRR. No JVD, murmurs, rubs, gallops or clicks.  Gastrointestinal system: Abdomen is nondistended, soft and nontender.  Normal bowel sounds heard. Central nervous system: Alert and oriented. No focal neurological deficits. Extremities: no edema. Skin: warm, dry Psychiatry: anxious.    Data  Reviewed: I have personally reviewed following labs and imaging studies  CBC: Recent Labs  Lab 04/26/20 2052 04/27/20 0539  WBC 7.8 5.9  NEUTROABS 6.4 5.2  HGB 12.9 13.1  HCT 39.5 39.6  MCV 86.6 86.3  PLT 320 309   Basic Metabolic Panel: Recent Labs  Lab 04/26/20 2052 04/27/20 0539  NA 134* 137  K 4.1 4.3  CL 95* 99  CO2 27 27  GLUCOSE 112* 155*  BUN 18 16  CREATININE 0.97 0.89  CALCIUM 8.6* 8.7*   GFR: Estimated Creatinine Clearance: 63.5 mL/min (by C-G formula based on SCr of 0.89 mg/dL). Liver Function Tests: Recent Labs  Lab 04/27/20 0539  AST 93*  ALT 80*  ALKPHOS 193*  BILITOT 0.5  PROT 7.1  ALBUMIN 2.9*   No results for input(s): LIPASE, AMYLASE in the last 168 hours. No results for input(s): AMMONIA in the last 168 hours. Coagulation Profile: No results for input(s): INR, PROTIME in the last 168 hours. Cardiac Enzymes: No results for input(s): CKTOTAL, CKMB, CKMBINDEX, TROPONINI in the last 168 hours. BNP (last 3 results) No results for input(s): PROBNP in the last 8760 hours. HbA1C: No results for input(s): HGBA1C in the last 72 hours. CBG: No results for input(s): GLUCAP in the last 168 hours. Lipid Profile: No results for input(s): CHOL, HDL, LDLCALC, TRIG, CHOLHDL, LDLDIRECT in the last 72 hours. Thyroid Function Tests: No results for input(s): TSH, T4TOTAL, FREET4, T3FREE, THYROIDAB in the  last 72 hours. Anemia Panel: Recent Labs    04/27/20 0539  FERRITIN 116   Sepsis Labs: No results for input(s): PROCALCITON, LATICACIDVEN in the last 168 hours.  No results found for this or any previous visit (from the past 240 hour(s)).       Radiology Studies: DG Chest Portable 1 View  Result Date: 04/26/2020 CLINICAL DATA:  COVID-19 positivity with increasing shortness of breath EXAM: PORTABLE CHEST 1 VIEW COMPARISON:  None FINDINGS: Cardiac shadow is within normal limits. Patchy airspace opacities are noted consistent with the given  clinical history of COVID-19 positivity. No effusion is seen. No bony abnormality is noted. IMPRESSION: Patchy airspace opacity consistent with the given clinical history of COVID-19 positivity. Electronically Signed   By: Alcide Clever M.D.   On: 04/26/2020 21:12        Scheduled Meds: . albuterol  2 puff Inhalation Q6H  . vitamin C  500 mg Oral Daily  . dexamethasone (DECADRON) injection  6 mg Intravenous Q24H  . enoxaparin (LOVENOX) injection  40 mg Subcutaneous Q24H  . lisinopril  20 mg Oral Daily  . multivitamin with minerals  1 tablet Oral Daily  . zinc sulfate  220 mg Oral Daily   Continuous Infusions: . remdesivir 100 mg in NS 100 mL      Assessment & Plan:   Active Problems:   Pneumonia due to COVID-19 virus   Acute respiratory failure due to COVID-44 [facility])   64 year old female with no significant past medical history, diagnosed with Covid on 10/6 with symptom onset 10/4 presenting with 2-day history of worsening shortness of breath.  Unvaccinated.  Declined monoclonal antibodies   Pneumonia due to COVID-19 virus  Acute respiratory failure due to COVID-19 [facility]) -Patient with O2 sat in the 70s on room air reported by EMS, placed on  90s on 5 L.  10/16- non tachypnic this am.  Discussed Remdesivir with pt and other medications that we use for Covid but she wants to think about it and currently declined at Received 1 dose of Remdesivir before she declined We will continue on IV steroids, albuterol, antitussives and vitamins Proning as tolerated We will continue monitoring inflammatory biomarkers   Essential hypertension -controlled Continue home lisinopril  Labetalol iv   DVT prophylaxis: lovenox Code Status:full Communication: none  Status is: Inpatient  Remains inpatient appropriate because:IV treatments appropriate due to intensity of illness or inability to take PO   Dispo: The patient is from: Home              Anticipated d/c is to: Home               Anticipated d/c date is: 3 days              Patient currently is not medically stable to d/c.            LOS: 1 day   Time spent: 35 min with >50% coc    Lynn Ito, MD [facility] Pager [phone]  If 7PM-7AM, please contact night-coverage [email or web address] Password [email or web address] 04/27/2020, 9:15 AM

## 2020-04-27 NOTE — ED Notes (Signed)
Hospitalist at bedside ,  Pt also given belongings that where brought from home

## 2020-04-27 NOTE — ED Notes (Signed)
Patient provided with additional pillow to prop under L arm per request.

## 2020-04-27 NOTE — ED Notes (Addendum)
Pt states she is uneasy about taking Remdesivir.  Pt states she had a reaction with last dose of elevated BP and is unsure if she wants any other doses, states she is uncomfortable at present, and wishes to have more time to think about it.  Advised pt that this is her choice and to think about it and let me know her decision.

## 2020-04-27 NOTE — Progress Notes (Signed)
Report received from [facility] in the ED.  Pt to transfer to 1C bed 107 for continued medical treatment

## 2020-04-27 NOTE — Plan of Care (Signed)
  Problem: Education: Goal: Knowledge of risk factors and measures for prevention of condition will improve Outcome: Progressing   Problem: Coping: Goal: Psychosocial and spiritual needs will be supported Outcome: Progressing   Problem: Respiratory: Goal: Will maintain a patent airway Outcome: Progressing Goal: Complications related to the disease process, condition or treatment will be avoided or minimized Outcome: Progressing   

## 2020-04-27 NOTE — ED Notes (Signed)
Patient provided with warm blanket and pillow per request.

## 2020-04-28 DIAGNOSIS — U071 COVID-19: Secondary | ICD-10-CM | POA: Diagnosis not present

## 2020-04-28 DIAGNOSIS — J1282 Pneumonia due to Coronavirus disease 2019: Secondary | ICD-10-CM | POA: Diagnosis not present

## 2020-04-28 DIAGNOSIS — J9600 Acute respiratory failure, unspecified whether with hypoxia or hypercapnia: Secondary | ICD-10-CM | POA: Diagnosis not present

## 2020-04-28 LAB — CBC WITH DIFFERENTIAL/PLATELET
Abs Immature Granulocytes: 0.06 10*3/uL (ref 0.00–0.07)
Basophils Absolute: 0 10*3/uL (ref 0.0–0.1)
Basophils Relative: 0 %
Eosinophils Absolute: 0 10*3/uL (ref 0.0–0.5)
Eosinophils Relative: 0 %
HCT: 37.6 % (ref 36.0–46.0)
Hemoglobin: 12.6 g/dL (ref 12.0–15.0)
Immature Granulocytes: 1 %
Lymphocytes Relative: 8 %
Lymphs Abs: 0.7 10*3/uL (ref 0.7–4.0)
MCH: 28.5 pg (ref 26.0–34.0)
MCHC: 33.5 g/dL (ref 30.0–36.0)
MCV: 85.1 fL (ref 80.0–100.0)
Monocytes Absolute: 0.4 10*3/uL (ref 0.1–1.0)
Monocytes Relative: 4 %
Neutro Abs: 7.4 10*3/uL (ref 1.7–7.7)
Neutrophils Relative %: 87 %
Platelets: 373 10*3/uL (ref 150–400)
RBC: 4.42 MIL/uL (ref 3.87–5.11)
RDW: 12.8 % (ref 11.5–15.5)
WBC: 8.5 10*3/uL (ref 4.0–10.5)
nRBC: 0 % (ref 0.0–0.2)

## 2020-04-28 LAB — COMPREHENSIVE METABOLIC PANEL
ALT: 68 U/L — ABNORMAL HIGH (ref 0–44)
AST: 55 U/L — ABNORMAL HIGH (ref 15–41)
Albumin: 2.7 g/dL — ABNORMAL LOW (ref 3.5–5.0)
Alkaline Phosphatase: 166 U/L — ABNORMAL HIGH (ref 38–126)
Anion gap: 11 (ref 5–15)
BUN: 27 mg/dL — ABNORMAL HIGH (ref 8–23)
CO2: 25 mmol/L (ref 22–32)
Calcium: 8.9 mg/dL (ref 8.9–10.3)
Chloride: 101 mmol/L (ref 98–111)
Creatinine, Ser: 0.88 mg/dL (ref 0.44–1.00)
GFR, Estimated: >60 mL/min (ref >60)
Glucose, Bld: 178 mg/dL — ABNORMAL HIGH (ref 70–99)
Potassium: 4.2 mmol/L (ref 3.5–5.1)
Sodium: 137 mmol/L (ref 135–145)
Total Bilirubin: 0.5 mg/dL (ref 0.3–1.2)
Total Protein: 6.3 g/dL — ABNORMAL LOW (ref 6.5–8.1)

## 2020-04-28 LAB — FIBRIN DERIVATIVES D-DIMER (ARMC ONLY): Fibrin derivatives D-dimer (ARMC): 451.46 ng/mL (FEU) (ref 0.00–499.00)

## 2020-04-28 LAB — C-REACTIVE PROTEIN: CRP: 8.7 mg/dL — ABNORMAL HIGH (ref <1.0)

## 2020-04-28 LAB — FERRITIN: Ferritin: 132 ng/mL (ref 11–307)

## 2020-04-28 NOTE — Discharge Summary (Signed)
Rachel Dickson ULA:[id number] DOB: June 09, 1956 DOA: 04/26/2020  PCP: Barbette Reichmann, MD  Admit date: 04/26/2020 Discharge date: 04/28/2020  Admitted From: home Disposition:  SIGNED OUT AMA     Condition:guarded CODE STATUS:full  Brief/Interim Summary: Rachel Banker Smithis a 64 y.o.femalewithhistory of hypertensiondiagnosed with Covid on 04/17/2020 with symptom onset 10/4 who had usual Covid symptoms but then started developing progressively worsening shortness of breath over the past few days. She is unvaccinated against Covid. She was apparently referred for monoclonal antibodies when she was diagnosed but then declined.On arrival of EMS O2 sat reportedly in the 70s. Upon arrival she had a low-grade temperature of 99 and was tachypneic at 24 saturating at 96 on 5 L O2. CXR Patchy airspace opacity consistent with the given clinical history of COVID-19 positivity.She did receive Remdesivir and Decadron in ED. Then later she refused Remdesivir despite discussion about medication .  She was placed on IV steroids. This am patient is adamant about going home, I tried explaining to her she needs one more night as we do not know how things will go. Also told patient had to ambulate her in the room for her o2 saturation. Patient was aggressive this am, wanted to speak to her "pcp " about this. I told her it was fine if she would like to discuss this with him. Told patient if she wants to go home , she will need to sign out AMA, and that her condition is guarded and death can even happen.  Received message from nurse that patient signed out AMA.     Discharge Diagnoses:  Active Problems:   Pneumonia due to COVID-19 virus   Acute respiratory failure due to COVID-19 [facility])    Discharge Instructions Signed AMA    Allergies  Allergen Reactions  . Ciprofloxacin Other (See Comments)    Never wants this medication, sister was prescribed this and developed Glynn Octave  . Chlorhexidine  Other (See Comments)    Skin burning  . Erythromycin Swelling  . Macrolides And Ketolides   . Sulfa Drugs Cross Reactors Hives  . Chloral Hydrate Rash    Consultations:     Procedures/Studies: DG Chest Portable 1 View  Result Date: 04/26/2020 CLINICAL DATA:  COVID-19 positivity with increasing shortness of breath EXAM: PORTABLE CHEST 1 VIEW COMPARISON:  None FINDINGS: Cardiac shadow is within normal limits. Patchy airspace opacities are noted consistent with the given clinical history of COVID-19 positivity. No effusion is seen. No bony abnormality is noted. IMPRESSION: Patchy airspace opacity consistent with the given clinical history of COVID-19 positivity. Electronically Signed   By: Alcide Clever M.D.   On: 04/26/2020 21:12       Subjective: Face fllushed mildly. Angry and bit aggressive during exam.   Discharge Exam: Vitals:   04/28/20 0408 04/28/20 0721  BP: (!) 156/74 125/67  Pulse: 87 98  Resp: (!) 23 20  Temp: 98.2 F (36.8 C) 98.1 F (36.7 C)  SpO2: 91% 90%   Vitals:   04/27/20 1726 04/27/20 2034 04/28/20 0408 04/28/20 0721  BP:  131/70 (!) 156/74 125/67  Pulse:  97 87 98  Resp:  20 (!) 23 20  Temp:  98.7 F (37.1 C) 98.2 F (36.8 C) 98.1 F (36.7 C)  TempSrc:  Oral    SpO2: 95% 91% 91% 90%  Weight:      Height:        General: Pt is alert, awake, in foul mood Cardiovascular: RRR, S1/S2 +, no rubs, no gallops  Respiratory:+rales b/l, no wheezing.  Abdominal: Soft, NT, ND, bowel sounds + Extremities: no calf pain or edema    The results of significant diagnostics from this hospitalization (including imaging, microbiology, ancillary and laboratory) are listed below for reference.     Microbiology: No results found for this or any previous visit (from the past 240 hour(s)).   Labs: BNP (last 3 results) No results for input(s): BNP in the last 8760 hours. Basic Metabolic Panel: Recent Labs  Lab 04/26/20 2052 04/27/20 0539 04/28/20 0715   NA 134* 137 137  K 4.1 4.3 4.2  CL 95* 99 101  CO2 27 27 25   GLUCOSE 112* 155* 178*  BUN 18 16 27*  CREATININE 0.97 0.89 0.88  CALCIUM 8.6* 8.7* 8.9   Liver Function Tests: Recent Labs  Lab 04/27/20 0539 04/28/20 0715  AST 93* 55*  ALT 80* 68*  ALKPHOS 193* 166*  BILITOT 0.5 0.5  PROT 7.1 6.3*  ALBUMIN 2.9* 2.7*   No results for input(s): LIPASE, AMYLASE in the last 168 hours. No results for input(s): AMMONIA in the last 168 hours. CBC: Recent Labs  Lab 04/26/20 2052 04/27/20 0539 04/28/20 0715  WBC 7.8 5.9 8.5  NEUTROABS 6.4 5.2 7.4  HGB 12.9 13.1 12.6  HCT 39.5 39.6 37.6  MCV 86.6 86.3 85.1  PLT 320 309 373   Cardiac Enzymes: No results for input(s): CKTOTAL, CKMB, CKMBINDEX, TROPONINI in the last 168 hours. BNP: Invalid input(s): POCBNP CBG: No results for input(s): GLUCAP in the last 168 hours. D-Dimer No results for input(s): DDIMER in the last 72 hours. Hgb A1c No results for input(s): HGBA1C in the last 72 hours. Lipid Profile No results for input(s): CHOL, HDL, LDLCALC, TRIG, CHOLHDL, LDLDIRECT in the last 72 hours. Thyroid function studies No results for input(s): TSH, T4TOTAL, T3FREE, THYROIDAB in the last 72 hours.  Invalid input(s): FREET3 Anemia work up Recent Labs    04/27/20 0539 04/28/20 0715  FERRITIN 116 132   Urinalysis    Component Value Date/Time   COLORURINE YELLOW (A) 11/27/2019 0542   APPEARANCEUR HAZY (A) 11/27/2019 0542   LABSPEC 1.008 11/27/2019 0542   PHURINE 5.0 11/27/2019 0542   GLUCOSEU NEGATIVE 11/27/2019 0542   HGBUR MODERATE (A) 11/27/2019 0542   BILIRUBINUR NEGATIVE 11/27/2019 0542   KETONESUR NEGATIVE 11/27/2019 0542   PROTEINUR NEGATIVE 11/27/2019 0542   UROBILINOGEN 0.2 03/20/2011 1621   NITRITE NEGATIVE 11/27/2019 0542   LEUKOCYTESUR NEGATIVE 11/27/2019 0542   Sepsis Labs Invalid input(s): PROCALCITONIN,  WBC,  LACTICIDVEN Microbiology No results found for this or any previous visit (from the past  240 hour(s)).   Time coordinating discharge: Over 30 minutes  SIGNED:   11/29/2019, MD  [facility] 04/28/2020, 10:27 AM Pager   If 7PM-7AM, please contact night-coverage [email or web address] Password [email or web address]

## 2020-04-28 NOTE — Progress Notes (Signed)
Pt leaving AMA was explained the risk factor that she could die.  Pt verbalized understandind.  Pt was 91% on room air. Dr Marylu Lund is aware of this situation

## 2020-04-28 NOTE — Plan of Care (Signed)
  Problem: Education: Goal: Knowledge of risk factors and measures for prevention of condition will improve Outcome: Progressing   

## 2020-06-20 ENCOUNTER — Ambulatory Visit: Payer: 59 | Admitting: Dermatology

## 2020-07-18 ENCOUNTER — Other Ambulatory Visit: Payer: Self-pay | Admitting: Infectious Diseases

## 2020-07-18 DIAGNOSIS — Z1231 Encounter for screening mammogram for malignant neoplasm of breast: Secondary | ICD-10-CM

## 2021-07-28 ENCOUNTER — Ambulatory Visit: Payer: 59 | Admitting: Dermatology

## 2021-12-13 IMAGING — CT CT RENAL STONE PROTOCOL
2 of 4 series · 15 of 46 positions shown, 17 images · non-contrast
Comparison: March 25, 2011

CLINICAL DATA: Right flank pain

EXAM:
CT ABDOMEN AND PELVIS WITHOUT CONTRAST
TECHNIQUE: Multidetector CT imaging of the abdomen and pelvis was performed
following the standard protocol without oral or IV contrast.

[Series 2: stone full standard · axial · 0.72mm/px · z∈[-534,-109]mm · 12 of 93 slices shown, 14 images]
[im 4/93  soft-tissue]
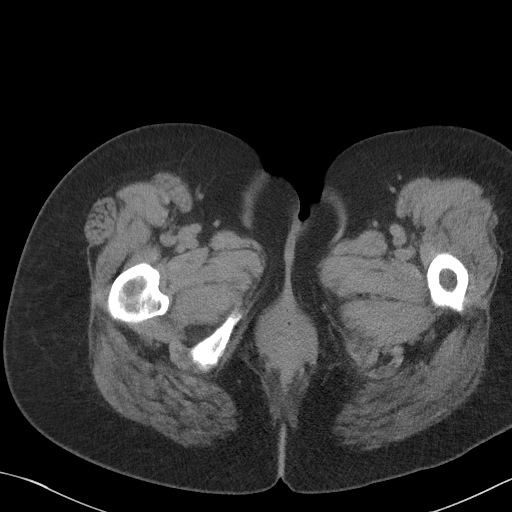
[im 4/93  bone]
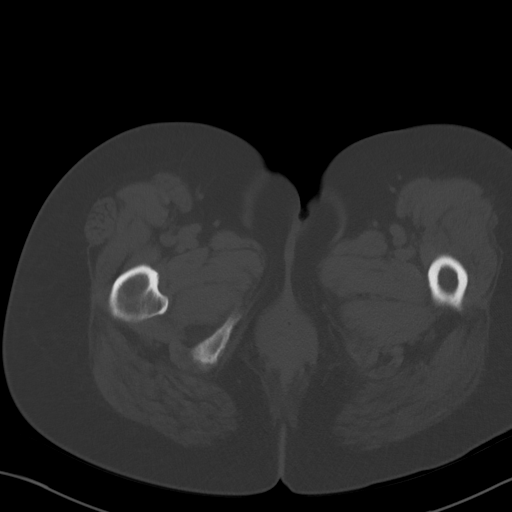
[im 12/93  soft-tissue]
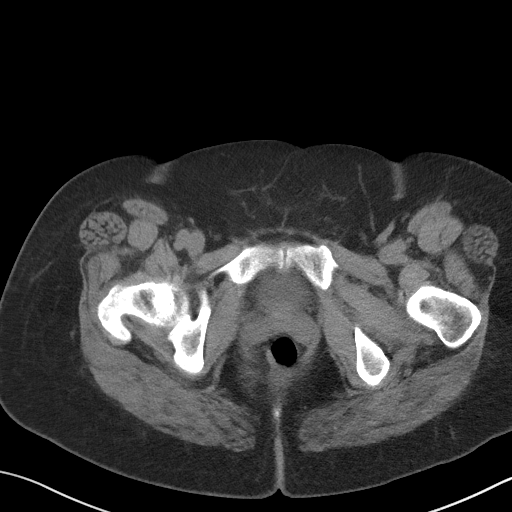
[im 20/93  soft-tissue]
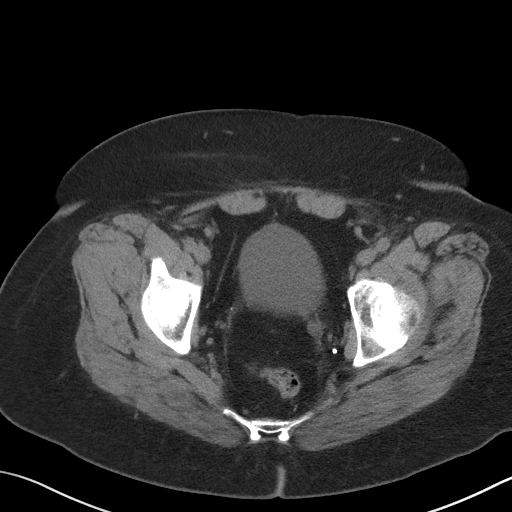
[im 27/93  soft-tissue]
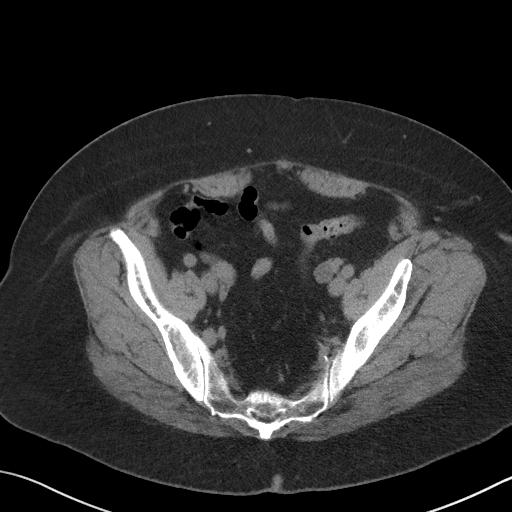
[im 35/93  soft-tissue]
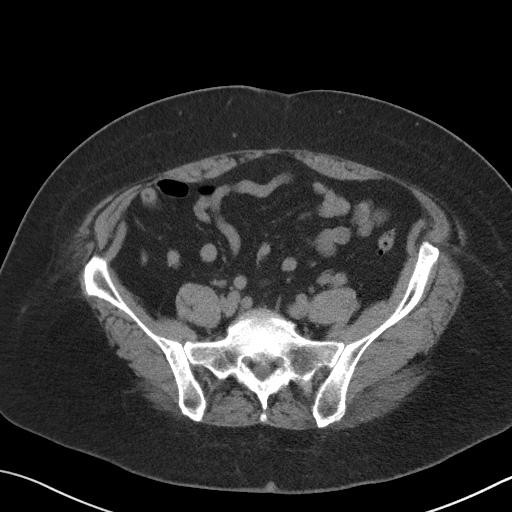
[im 43/93  soft-tissue]
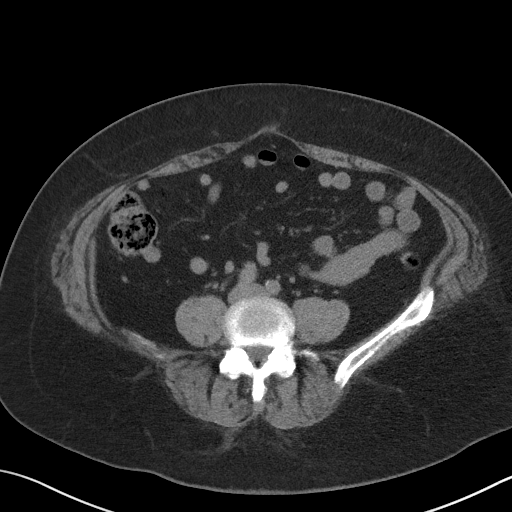
[im 50/93  soft-tissue]
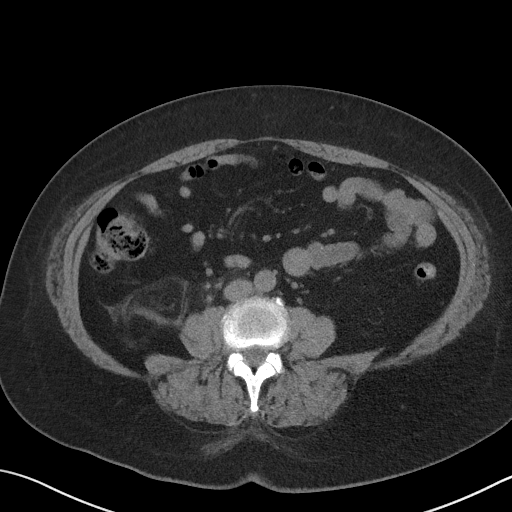
[im 58/93  soft-tissue]
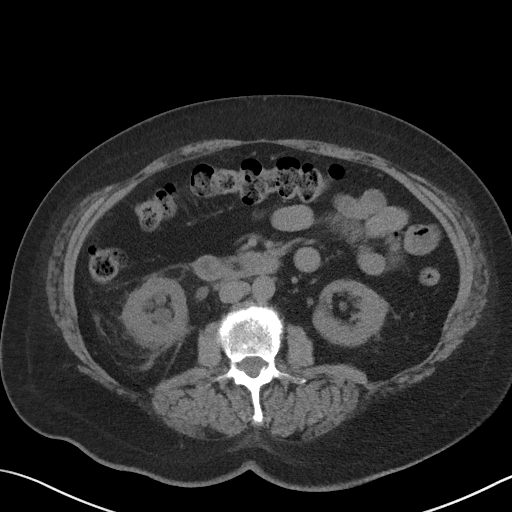
[im 66/93  soft-tissue]
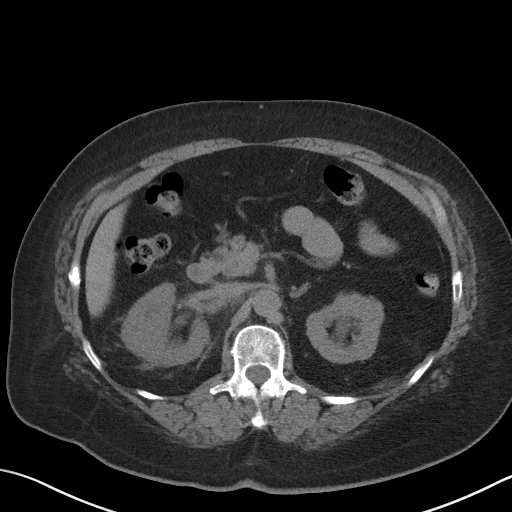
[im 66/93  bone]
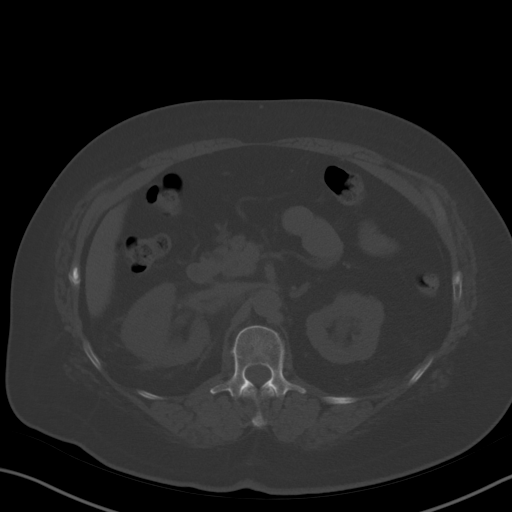
[im 73/93  soft-tissue]
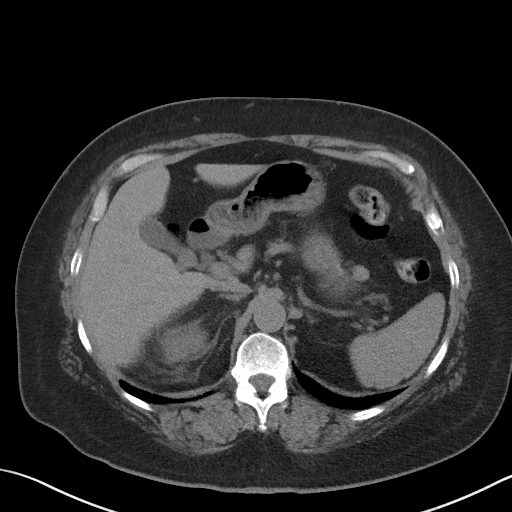
[im 81/93  soft-tissue]
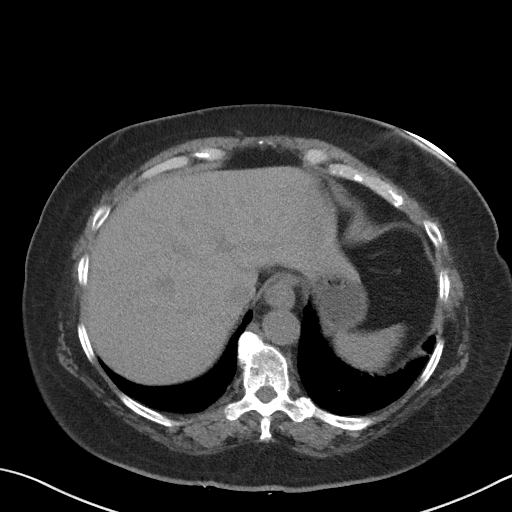
[im 89/93  soft-tissue]
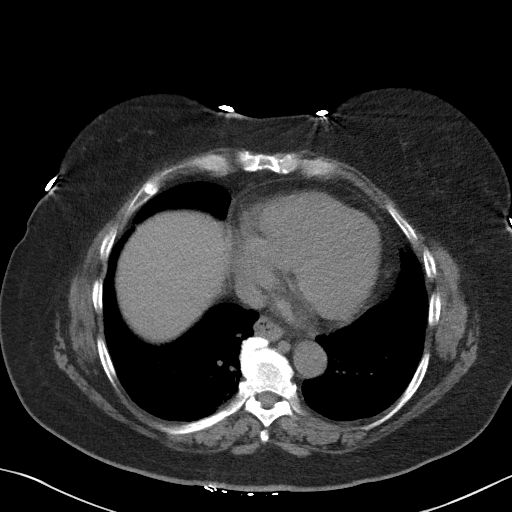

[Series 5: coronal · coronal · 0.73mm/px · 3 of 154 slices shown]
[im 52/154  soft-tissue]
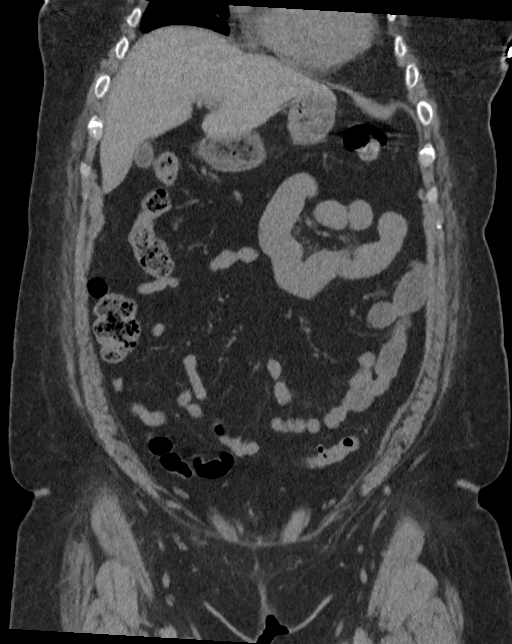
[im 69/154  soft-tissue]
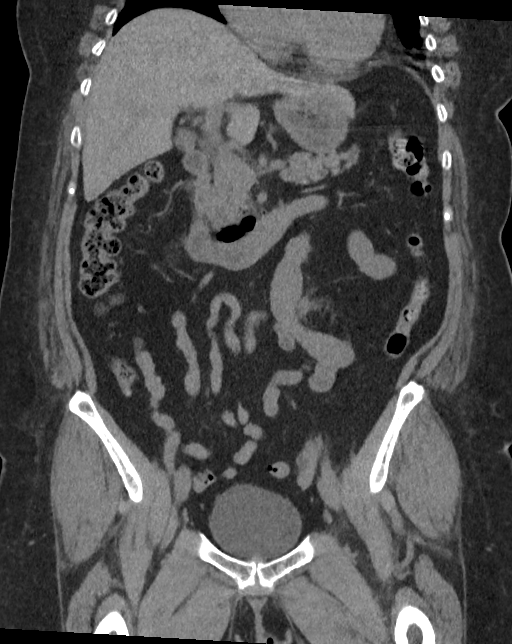
[im 86/154  soft-tissue]
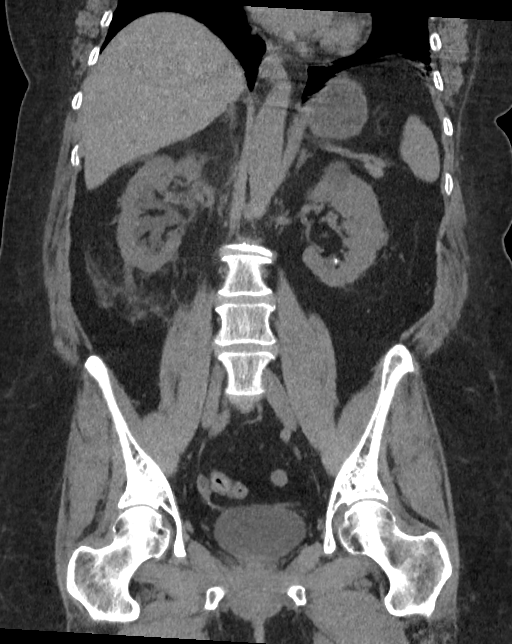

[15 of 46 positions shown; findings below may reference images not displayed]

FINDINGS: Lower chest: There is scarring in the lung bases, more on the right
than on the left. No lung base edema or airspace opacity. There is a
small hiatal hernia.

Hepatobiliary: No focal liver lesions evident on this noncontrast
enhanced study. Gallbladder wall is not appreciably thickened. There
is no biliary duct dilatation.

Pancreas: There is no appreciable pancreatic mass or inflammatory
focus.

Spleen: No splenic lesions are evident.

Adrenals/Urinary Tract: Adrenals bilaterally appear normal. There is
a cyst arising from the upper pole of the left kidney measuring
x 2.7 cm. There is moderate hydronephrosis on the right. There is
soft tissue stranding in the perinephric fascia on the right. There
is no appreciable hydronephrosis on the left. There is a calculus in
the lower pole of the left kidney measuring 6 x 4 mm. There is a
calculus in the right ureter at the L3-4 level measuring 1.0 x
cm. No other ureteral calculi are evident. Urinary bladder is
midline with wall thickness within normal limits.

Stomach/Bowel: There are scattered sigmoid diverticula without
diverticulitis. There is no appreciable bowel wall or mesenteric
thickening. There is moderate stool in the colon. There is no
demonstrable bowel obstruction. The terminal ileum appears normal.
There is no demonstrable free air or portal venous air.

Vascular/Lymphatic: There is no abdominal aortic aneurysm. There are
scattered foci of calcification in the common iliac arteries
bilaterally. There is no evident adenopathy in the abdomen or
pelvis.

Reproductive: Uterus is absent.  There is no pelvic mass.

Other: Appendix appears normal. No abscess or ascites is evident in
the abdomen or pelvis. There is mild fat in the umbilicus.

Musculoskeletal: There is degenerative change in the lower thoracic
and lumbar regions. No blastic or lytic bone lesions are evident. No
intramuscular lesions are appreciable.
IMPRESSION: 1. There is a calculus in the right ureter at the L3-4 level
measuring 1.0 x 0.7 cm causing moderate right-sided hydronephrosis
and moderate perinephric stranding in the perinephric fascia on the
right.

2.  Nonobstructing 6 x 4 mm calculus lower pole left kidney.

3. No bowel wall thickening or bowel obstruction. No abscess in the
abdomen or pelvis. Appendix appears normal. There are occasional
sigmoid diverticula without diverticulitis.

4.  Small hiatal hernia.

5.  Uterus absent.

## 2022-06-08 ENCOUNTER — Ambulatory Visit
Admission: RE | Admit: 2022-06-08 | Discharge: 2022-06-08 | Disposition: A | Discharge status: Home / Self Care | Payer: 59 | Source: Ambulatory Visit | Attending: Orthopedic Surgery | Admitting: Orthopedic Surgery

## 2022-06-08 ENCOUNTER — Other Ambulatory Visit: Payer: Self-pay | Admitting: Orthopedic Surgery

## 2022-06-08 DIAGNOSIS — M1712 Unilateral primary osteoarthritis, left knee: Secondary | ICD-10-CM | POA: Diagnosis present

## 2022-06-08 DIAGNOSIS — M7989 Other specified soft tissue disorders: Secondary | ICD-10-CM | POA: Insufficient documentation
